# Patient Record
Sex: Male | Born: 1965 | Hispanic: No | Marital: Single | Smoking: Never smoker
Health system: Southern US, Community
[De-identification: ages and names within clinical notes are randomized; demographics above are authoritative.]

## PROBLEM LIST (undated history)

## (undated) DIAGNOSIS — R002 Palpitations: Secondary | ICD-10-CM

## (undated) HISTORY — PX: HERNIA REPAIR: SHX51

## (undated) HISTORY — PX: ABDOMINAL SURGERY: SHX537

## (undated) HISTORY — DX: Palpitations: R00.2

---

## 2006-08-05 ENCOUNTER — Encounter: Admission: RE | Admit: 2006-08-05 | Discharge: 2006-08-05 | Payer: Self-pay | Admitting: Family Medicine

## 2013-09-08 ENCOUNTER — Ambulatory Visit (INDEPENDENT_AMBULATORY_CARE_PROVIDER_SITE_OTHER): Payer: 59 | Admitting: Cardiology

## 2013-09-08 ENCOUNTER — Encounter: Payer: Self-pay | Admitting: Cardiology

## 2013-09-08 VITALS — BP 120/82 | HR 77 | Ht 70.0 in | Wt 206.0 lb

## 2013-09-08 DIAGNOSIS — R002 Palpitations: Secondary | ICD-10-CM

## 2013-09-08 DIAGNOSIS — R0989 Other specified symptoms and signs involving the circulatory and respiratory systems: Secondary | ICD-10-CM

## 2013-09-08 DIAGNOSIS — R55 Syncope and collapse: Secondary | ICD-10-CM

## 2013-09-08 DIAGNOSIS — R6889 Other general symptoms and signs: Secondary | ICD-10-CM

## 2013-09-08 NOTE — Patient Instructions (Addendum)
Your physician recommends that you continue on your current medications as directed. Please refer to the Current Medication list given to you today.  Your physician has requested that you have an exercise tolerance test. For further information please visit [email or web address]. Please also follow instruction sheet, as given.  Your physician has recommended that you wear a 24hour holter monitor (ACTDX). Holter monitors are medical devices that record the heart's electrical activity. Doctors most often use these monitors to diagnose arrhythmias. Arrhythmias are problems with the speed or rhythm of the heartbeat. The monitor is a small, portable device. You can wear one while you do your normal daily activities. This is usually used to diagnose what is causing palpitations/syncope (passing out).  Your physician has recommended that you wear an 30 day event monitor (ACTDX). Event monitors are medical devices that record the heart's electrical activity. Doctors most often us these monitors to diagnose arrhythmias. Arrhythmias are problems with the speed or rhythm of the heartbeat. The monitor is a small, portable device. You can wear one while you do your normal daily activities. This is usually used to diagnose what is causing palpitations/syncope (passing out).  Your physician recommends that you schedule a follow-up appointment in: 5 weeks with Dr. Anne FuSkains
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
## 2013-09-12 ENCOUNTER — Encounter: Payer: Self-pay | Admitting: *Deleted

## 2013-09-12 NOTE — Progress Notes (Signed)
Patient ID: Mathew Hernandez, male   DOB: 04/27/1966, 48 y.o.   MRN: [id number] Patient enrolled for [employer] to mail a ACT_EX 24 hour to 30 day cardiac event monitor to his home.

## 2013-09-21 ENCOUNTER — Telehealth: Payer: Self-pay | Admitting: Cardiology

## 2013-09-21 ENCOUNTER — Telehealth: Payer: Self-pay | Admitting: *Deleted

## 2013-09-21 NOTE — Telephone Encounter (Signed)
x

## 2013-09-21 NOTE — Telephone Encounter (Signed)
New problem    Pt needs a call back about today inregards to the monitor insturction please.

## 2013-10-03 ENCOUNTER — Telehealth (HOSPITAL_COMMUNITY): Payer: Self-pay

## 2013-10-05 ENCOUNTER — Ambulatory Visit (HOSPITAL_COMMUNITY)
Admission: RE | Admit: 2013-10-05 | Discharge: 2013-10-05 | Disposition: A | Discharge status: Home / Self Care | Payer: 59 | Source: Ambulatory Visit | Attending: Cardiovascular Disease | Admitting: Cardiovascular Disease

## 2013-10-05 ENCOUNTER — Other Ambulatory Visit: Payer: Self-pay

## 2013-10-05 ENCOUNTER — Ambulatory Visit (HOSPITAL_BASED_OUTPATIENT_CLINIC_OR_DEPARTMENT_OTHER)
Admission: RE | Admit: 2013-10-05 | Discharge: 2013-10-05 | Disposition: A | Discharge status: Home / Self Care | Payer: 59 | Source: Ambulatory Visit | Attending: Cardiovascular Disease | Admitting: Cardiovascular Disease

## 2013-10-05 DIAGNOSIS — R55 Syncope and collapse: Secondary | ICD-10-CM

## 2013-10-05 NOTE — Progress Notes (Signed)
2D Echo Performed 10/05/2013    Gaylin Bulthuis, RCS  

## 2013-10-09 ENCOUNTER — Ambulatory Visit: Payer: 59 | Admitting: Cardiology

## 2013-10-10 ENCOUNTER — Telehealth: Payer: Self-pay | Admitting: *Deleted

## 2013-10-10 NOTE — Telephone Encounter (Signed)
Informed patient of echo results per Dr. Judd Gaudier notes. He also asks about results of exercise tolerance test. Adv that has not yet been reviewed by Dr. Anne Fu. He states that he has ben feeling well for past couple weeks and because of this, and the expense of the monitor and his with his job, wearing it would be cumbersome. He thinks he's going to send it back instead of activating it, unless Dr. Anne Fu feels it is extremely, extremely imperative for him to wear it; then he will reconsider wearing it.

## 2013-10-12 NOTE — Telephone Encounter (Signed)
Follow up ° ° ° ° °Want stress test results °

## 2013-10-12 NOTE — Telephone Encounter (Signed)
Went over preliminary results with pt and informed him that Dr Anne Fu has not given his final results yet.  Told pt that after Dr Anne Fu reviews the final result, that someone from our office will contact him with this.  Pt verbalized understanding and agrees with this plan.

## 2013-10-18 ENCOUNTER — Telehealth: Payer: Self-pay | Admitting: Cardiology

## 2013-10-18 NOTE — Telephone Encounter (Signed)
Excellent, low risk stress test. Donato SchultzSKAINS, Elliet Goodnow, MD

## 2013-10-18 NOTE — Telephone Encounter (Signed)
Stress test has not been reviewed by Dr. Anne FuSkains. Will forward note to MD for review and then call pt with results.

## 2013-10-18 NOTE — Telephone Encounter (Signed)
Stress test was excellent. Low risk. (I believe I may have sent another message with this result as well. This may be duplication) I would like him to continue the monitor. Thanks.  Donato SchultzSKAINS, MARK, MD

## 2013-10-18 NOTE — Telephone Encounter (Signed)
Follow Up    Pt calling following up of stress test results. Please call.

## 2013-10-19 NOTE — Telephone Encounter (Signed)
Spoke with pt and reviewed stress test results with him. Also instructed him Dr. Anne FuSkains would like him to wear event monitor.  Pt reports he has sent the monitor back as he was unable to find out what out of pocket cost would be to him and also because it would be difficult to wear while he is working. 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 Pt is asking about having a heart cath as a friend recently had to have bypass surgery after having testing that did not show problems.  Pt reports he is feeling well. No chest pain. No irregular heart beat. No syncope. Symptoms he had prior to seeing Dr. Anne FuSkains have resolved.  Will send to Dr. Anne FuSkains for further recommendations.
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
## 2013-10-19 NOTE — Telephone Encounter (Signed)
We would only proceed with heart catheterization if symptoms of chest pain/angina occurred.

## 2013-11-02 NOTE — Telephone Encounter (Signed)
Encounter complete. 

## 2013-11-16 NOTE — Telephone Encounter (Signed)
Encounter complete. 

## 2016-05-15 DIAGNOSIS — E291 Testicular hypofunction: Secondary | ICD-10-CM | POA: Diagnosis not present

## 2016-05-28 DIAGNOSIS — E291 Testicular hypofunction: Secondary | ICD-10-CM | POA: Diagnosis not present

## 2016-06-11 DIAGNOSIS — E291 Testicular hypofunction: Secondary | ICD-10-CM | POA: Diagnosis not present

## 2016-06-26 DIAGNOSIS — E349 Endocrine disorder, unspecified: Secondary | ICD-10-CM | POA: Diagnosis not present

## 2016-07-10 DIAGNOSIS — E291 Testicular hypofunction: Secondary | ICD-10-CM | POA: Diagnosis not present

## 2016-07-24 DIAGNOSIS — E291 Testicular hypofunction: Secondary | ICD-10-CM | POA: Diagnosis not present

## 2016-08-07 DIAGNOSIS — E291 Testicular hypofunction: Secondary | ICD-10-CM | POA: Diagnosis not present

## 2016-08-24 DIAGNOSIS — E349 Endocrine disorder, unspecified: Secondary | ICD-10-CM | POA: Diagnosis not present

## 2016-09-09 DIAGNOSIS — Z79899 Other long term (current) drug therapy: Secondary | ICD-10-CM | POA: Diagnosis not present

## 2016-09-09 DIAGNOSIS — G4733 Obstructive sleep apnea (adult) (pediatric): Secondary | ICD-10-CM | POA: Diagnosis not present

## 2016-09-09 DIAGNOSIS — E349 Endocrine disorder, unspecified: Secondary | ICD-10-CM | POA: Diagnosis not present

## 2016-09-09 DIAGNOSIS — E781 Pure hyperglyceridemia: Secondary | ICD-10-CM | POA: Diagnosis not present

## 2016-09-09 DIAGNOSIS — R7303 Prediabetes: Secondary | ICD-10-CM | POA: Diagnosis not present

## 2016-09-24 DIAGNOSIS — E349 Endocrine disorder, unspecified: Secondary | ICD-10-CM | POA: Diagnosis not present

## 2016-10-09 DIAGNOSIS — E349 Endocrine disorder, unspecified: Secondary | ICD-10-CM | POA: Diagnosis not present

## 2016-10-21 DIAGNOSIS — E349 Endocrine disorder, unspecified: Secondary | ICD-10-CM | POA: Diagnosis not present

## 2016-11-05 DIAGNOSIS — E349 Endocrine disorder, unspecified: Secondary | ICD-10-CM | POA: Diagnosis not present

## 2016-11-19 DIAGNOSIS — E349 Endocrine disorder, unspecified: Secondary | ICD-10-CM | POA: Diagnosis not present

## 2016-12-03 DIAGNOSIS — E349 Endocrine disorder, unspecified: Secondary | ICD-10-CM | POA: Diagnosis not present

## 2016-12-18 DIAGNOSIS — E291 Testicular hypofunction: Secondary | ICD-10-CM | POA: Diagnosis not present

## 2017-01-01 DIAGNOSIS — E291 Testicular hypofunction: Secondary | ICD-10-CM | POA: Diagnosis not present

## 2017-01-15 DIAGNOSIS — E349 Endocrine disorder, unspecified: Secondary | ICD-10-CM | POA: Diagnosis not present

## 2017-01-28 DIAGNOSIS — E349 Endocrine disorder, unspecified: Secondary | ICD-10-CM | POA: Diagnosis not present

## 2017-02-11 DIAGNOSIS — E349 Endocrine disorder, unspecified: Secondary | ICD-10-CM | POA: Diagnosis not present

## 2017-02-23 DIAGNOSIS — E349 Endocrine disorder, unspecified: Secondary | ICD-10-CM | POA: Diagnosis not present

## 2017-03-10 DIAGNOSIS — E349 Endocrine disorder, unspecified: Secondary | ICD-10-CM | POA: Diagnosis not present

## 2017-03-17 DIAGNOSIS — E349 Endocrine disorder, unspecified: Secondary | ICD-10-CM | POA: Diagnosis not present

## 2017-03-17 DIAGNOSIS — R7303 Prediabetes: Secondary | ICD-10-CM | POA: Diagnosis not present

## 2017-03-17 DIAGNOSIS — E781 Pure hyperglyceridemia: Secondary | ICD-10-CM | POA: Diagnosis not present

## 2017-03-17 DIAGNOSIS — Z79899 Other long term (current) drug therapy: Secondary | ICD-10-CM | POA: Diagnosis not present

## 2017-03-23 DIAGNOSIS — E349 Endocrine disorder, unspecified: Secondary | ICD-10-CM | POA: Diagnosis not present

## 2017-04-06 DIAGNOSIS — E349 Endocrine disorder, unspecified: Secondary | ICD-10-CM | POA: Diagnosis not present

## 2017-04-13 ENCOUNTER — Other Ambulatory Visit: Payer: Self-pay

## 2017-04-13 ENCOUNTER — Emergency Department (HOSPITAL_COMMUNITY)
Admission: EM | Admit: 2017-04-13 | Discharge: 2017-04-13 | Disposition: A | Discharge status: Home / Self Care | Payer: Worker's Compensation | Attending: Emergency Medicine | Admitting: Emergency Medicine

## 2017-04-13 DIAGNOSIS — T5801XA Toxic effect of carbon monoxide from motor vehicle exhaust, accidental (unintentional), initial encounter: Secondary | ICD-10-CM | POA: Diagnosis present

## 2017-04-13 LAB — CARBOXYHEMOGLOBIN - COOX: Carboxyhemoglobin: 1 % (ref 0.5–1.5)
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
## 2017-04-13 NOTE — Discharge Instructions (Signed)
You have been evaluated for carbon monoxide poisoning.  It appears to be minor at this time.  Your carboxyhemoglobin level was 1.0%, which is within the normal range. Please return to the ED for worsening headache, vomiting, persistent dizziness, confusion, loss of balance, chest pain, shortness of breath, or any other major concerns.  Your blood pressure was noted to be elevated today. Please follow up with your primary care provider as soon as possible on this matter.
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
## 2017-04-20 DIAGNOSIS — E349 Endocrine disorder, unspecified: Secondary | ICD-10-CM | POA: Diagnosis not present

## 2017-05-05 DIAGNOSIS — E349 Endocrine disorder, unspecified: Secondary | ICD-10-CM | POA: Diagnosis not present

## 2017-05-19 DIAGNOSIS — E349 Endocrine disorder, unspecified: Secondary | ICD-10-CM | POA: Diagnosis not present

## 2017-06-02 DIAGNOSIS — E349 Endocrine disorder, unspecified: Secondary | ICD-10-CM | POA: Diagnosis not present

## 2017-06-16 DIAGNOSIS — E349 Endocrine disorder, unspecified: Secondary | ICD-10-CM | POA: Diagnosis not present

## 2017-06-30 DIAGNOSIS — E349 Endocrine disorder, unspecified: Secondary | ICD-10-CM | POA: Diagnosis not present

## 2017-07-16 DIAGNOSIS — E349 Endocrine disorder, unspecified: Secondary | ICD-10-CM | POA: Diagnosis not present

## 2017-07-30 DIAGNOSIS — E349 Endocrine disorder, unspecified: Secondary | ICD-10-CM | POA: Diagnosis not present

## 2017-08-13 DIAGNOSIS — E349 Endocrine disorder, unspecified: Secondary | ICD-10-CM | POA: Diagnosis not present

## 2017-08-27 DIAGNOSIS — E349 Endocrine disorder, unspecified: Secondary | ICD-10-CM | POA: Diagnosis not present

## 2017-09-10 DIAGNOSIS — E349 Endocrine disorder, unspecified: Secondary | ICD-10-CM | POA: Diagnosis not present

## 2017-09-14 DIAGNOSIS — R7303 Prediabetes: Secondary | ICD-10-CM | POA: Diagnosis not present

## 2017-09-14 DIAGNOSIS — Z79899 Other long term (current) drug therapy: Secondary | ICD-10-CM | POA: Diagnosis not present

## 2017-09-14 DIAGNOSIS — G4733 Obstructive sleep apnea (adult) (pediatric): Secondary | ICD-10-CM | POA: Diagnosis not present

## 2017-09-14 DIAGNOSIS — E349 Endocrine disorder, unspecified: Secondary | ICD-10-CM | POA: Diagnosis not present

## 2017-09-14 DIAGNOSIS — E781 Pure hyperglyceridemia: Secondary | ICD-10-CM | POA: Diagnosis not present

## 2017-09-24 DIAGNOSIS — E349 Endocrine disorder, unspecified: Secondary | ICD-10-CM | POA: Diagnosis not present

## 2017-10-07 DIAGNOSIS — E349 Endocrine disorder, unspecified: Secondary | ICD-10-CM | POA: Diagnosis not present

## 2017-10-13 DIAGNOSIS — E349 Endocrine disorder, unspecified: Secondary | ICD-10-CM | POA: Diagnosis not present

## 2017-10-21 DIAGNOSIS — E349 Endocrine disorder, unspecified: Secondary | ICD-10-CM | POA: Diagnosis not present

## 2017-11-01 ENCOUNTER — Encounter (HOSPITAL_COMMUNITY): Payer: Self-pay | Admitting: *Deleted

## 2017-11-01 ENCOUNTER — Emergency Department (HOSPITAL_COMMUNITY)
Admission: EM | Admit: 2017-11-01 | Discharge: 2017-11-01 | Disposition: A | Discharge status: Home / Self Care | Payer: 59 | Attending: Emergency Medicine | Admitting: Emergency Medicine

## 2017-11-01 ENCOUNTER — Emergency Department (HOSPITAL_COMMUNITY): Payer: 59

## 2017-11-01 ENCOUNTER — Other Ambulatory Visit: Payer: Self-pay

## 2017-11-01 DIAGNOSIS — R002 Palpitations: Secondary | ICD-10-CM

## 2017-11-01 DIAGNOSIS — R0789 Other chest pain: Secondary | ICD-10-CM | POA: Diagnosis not present

## 2017-11-01 LAB — BASIC METABOLIC PANEL WITH GFR
Anion gap: 11 (ref 5–15)
BUN: 11 mg/dL (ref 6–20)
CO2: 26 mmol/L (ref 22–32)
Calcium: 9.2 mg/dL (ref 8.9–10.3)
Chloride: 102 mmol/L (ref 98–111)
Creatinine, Ser: 1.26 mg/dL — ABNORMAL HIGH (ref 0.61–1.24)
GFR calc Af Amer: >60 mL/min
GFR calc non Af Amer: >60 mL/min
Glucose, Bld: 155 mg/dL — ABNORMAL HIGH (ref 70–99)
Potassium: 3.9 mmol/L (ref 3.5–5.1)
Sodium: 139 mmol/L (ref 135–145)

## 2017-11-01 LAB — CBC
HCT: 53 % — ABNORMAL HIGH (ref 39.0–52.0)
Hemoglobin: 18 g/dL — ABNORMAL HIGH (ref 13.0–17.0)
MCH: 29.7 pg (ref 26.0–34.0)
MCHC: 34 g/dL (ref 30.0–36.0)
MCV: 87.3 fL (ref 78.0–100.0)
Platelets: 267 K/uL (ref 150–400)
RBC: 6.07 MIL/uL — ABNORMAL HIGH (ref 4.22–5.81)
RDW: 12 % (ref 11.5–15.5)
WBC: 7.9 K/uL (ref 4.0–10.5)

## 2017-11-01 LAB — I-STAT TROPONIN, ED: Troponin i, poc: 0 ng/mL (ref 0.00–0.08)

## 2017-11-01 MED ORDER — METOPROLOL SUCCINATE ER 25 MG PO TB24: 25.0000 mg | ORAL_TABLET | Freq: Every day | ORAL | 0 refills | Status: DC

## 2017-11-01 NOTE — ED Triage Notes (Signed)
Pt states lightheadedness and palpitations for the last several weeks, but has been increasing recently.

## 2017-11-01 NOTE — ED Provider Notes (Signed)
[facility] Provider Note   CSN: [id number] Arrival date & time: 11/01/17  1507     History   Chief Complaint Chief Complaint  Patient presents with  . Palpitations    HPI Mathew Hernandez is a 52 y.o. male presenting for evaluation of palpitations.  Patient states over the past 3 to 5 weeks, he has been having intermittent palpitations.  He states that intermittently throughout the day, he will feel something abnormal in his chest and feel a "shot of anxiety."  This is not associated with exertion, oral intake, time of day, or caffeine use.  Patient denies cardiac history, although states several years ago he was found to have an arrhythmia.  He had a normal stress test and echo, was suggested that he wear a Holter monitor, but refused at that time.  He has not followed up with cardiology since.  He has a history of sleep apnea, but has not been wearing his CPAP regularly.  No other medical problems.  He denies associated chest pain or shortness of breath when he feels the palpitation.  He denies recent fevers, chills, cough, nausea, vomiting, abdominal pain, urinary symptoms, abnormal bowel movements.  He does not smoke cigarettes, denies alcohol use, denies drug use.  He denies a family history of MI.   HPI  History reviewed. No pertinent past medical history.  There are no active problems to display for this patient.   Past Surgical History:  Procedure Laterality Date  . ABDOMINAL SURGERY    . HERNIA REPAIR     r inguinal        Home Medications    Prior to Admission medications   Medication Sig Start Date End Date Taking? Authorizing Provider  metoprolol succinate (TOPROL-XL) 25 MG 24 hr tablet Take 1 tablet (25 mg total) by mouth daily. 11/01/17   Arihanna Estabrook, PA-C    Family History Family History  Problem Relation Age of Onset  . Diabetes Mother   . Hypertension Father     Social History Social History   Tobacco  Use  . Smoking status: Never Smoker  . Smokeless tobacco: Never Used  Substance Use Topics  . Alcohol use: Never    Frequency: Never  . Drug use: Never     Allergies   Patient has no known allergies.   Review of Systems Review of Systems  Cardiovascular: Positive for palpitations.  All other systems reviewed and are negative.    Physical Exam Updated Vital Signs BP (!) 144/94 (BP Location: Right Arm)   Pulse 82   Temp 97.9 F (36.6 C) (Oral)   Resp 14   Ht 5\' 10"  (1.778 m)   Wt 90.7 kg (200 lb)   SpO2 94%   BMI 28.70 kg/m   Physical Exam  Constitutional: He is oriented to person, place, and time. He appears well-developed and well-nourished. No distress.  Appears no distress  HENT:  Head: Normocephalic and atraumatic.  Eyes: Pupils are equal, round, and reactive to light. Conjunctivae and EOM are normal.  Neck: Normal range of motion. Neck supple.  Cardiovascular: Normal rate and intact distal pulses.  Occasional skipped beat.  Otherwise regular rate and rhythm.  Pulmonary/Chest: Effort normal and breath sounds normal. No respiratory distress. He has no wheezes.  Abdominal: Soft. He exhibits no distension and no mass. There is no tenderness. There is no guarding.  Musculoskeletal: Normal range of motion. He exhibits no edema or tenderness.  No leg pain  or swelling.  Radial pedal pulses intact bilaterally  Neurological: He is alert and oriented to person, place, and time.  Skin: Skin is warm and dry.  Psychiatric: He has a normal mood and affect.  Nursing note and vitals reviewed.    ED Treatments / Results  Labs (all labs ordered are listed, but only abnormal results are displayed) Labs Reviewed  BASIC METABOLIC PANEL - Abnormal; Notable for the following components:      Result Value   Glucose, Bld 155 (*)    Creatinine, Ser 1.26 (*)    All other components within normal limits  CBC - Abnormal; Notable for the following components:   RBC 6.07 (*)     Hemoglobin 18.0 (*)    HCT 53.0 (*)    All other components within normal limits  I-STAT TROPONIN, ED    EKG None  Radiology Dg Chest 2 View  Result Date: 11/01/2017 CLINICAL DATA:  Lightheadedness and palpitations for the past several weeks with increasing frequency. History of diabetes. Nonsmoker. EXAM: CHEST - 2 VIEW COMPARISON:  Chest x-ray included with an abdominal series dated August 05, 2006 FINDINGS: The lungs are well-expanded. The interstitial markings are mildly prominent. There is no alveolar infiltrate or pleural effusion. The heart and pulmonary vascularity are normal. The mediastinum is normal in width. The bony thorax is unremarkable. IMPRESSION: Mildly increased interstitial markings bilaterally suggest chronic bronchitic changes. No alveolar pneumonia nor CHF. Electronically Signed   By: David  [place] M.D.   On: 11/01/2017 15:42    Procedures Procedures (including critical care time)  Medications Ordered in ED Medications - No data to display   Initial Impression / Assessment and Plan / ED Course  I have reviewed the triage vital signs and the nursing notes.  Pertinent labs & imaging results that were available during my care of the patient were reviewed by me and considered in my medical decision making (see chart for details).     Patient presented for evaluation of palpitations.  Physical exam reassuring, patient appears nontoxic.  Speaking in full sentences without difficulty.  Cardiac exam shows occasional skipped beat without irregular rhythm.  Initial work-up reassuring, troponin negative.  Chest x-ray reviewed and interpreted by me, no pneumonia, pneumothorax, or effusions.  EKG shows PVC, no stemi. Case discussed with attending, Dr. Estell Harpin agrees to plan.  Will discharge patient on metoprolol for symptomatic control palpitations.  Follow-up with cardiology for further evaluation and management.  Discussed cessation of caffeine use.  At this time, patient appears  safe for discharge.  Return precautions given.  Patient states he understands and agrees to plan.   Final Clinical Impressions(s) / ED Diagnoses   Final diagnoses:  Palpitations    ED Discharge Orders        Ordered    metoprolol succinate (TOPROL-XL) 25 MG 24 hr tablet  Daily     11/01/17 1829       Alveria Apley, PA-C 11/01/17 2141    Bethann Berkshire, MD 11/04/17 [phone]

## 2017-11-01 NOTE — ED Notes (Signed)
D/c reviewed with patient 

## 2017-11-01 NOTE — Discharge Instructions (Addendum)
Take metoprolol once a day.  Have caution while taking this medicine, if you become dizzy, lightheaded, or feel like you are about to pass out, stop taking this medication. Stop using caffeine, this includes Excedrin, caffeine pills, and 5-hour energy. Follow-up with cardiology for further evaluation and management of your symptoms. Return to the emergency room if you develop chest pain, difficulty breathing, or any new or concerning symptoms.

## 2017-11-01 NOTE — ED Notes (Addendum)
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

## 2017-11-05 DIAGNOSIS — E349 Endocrine disorder, unspecified: Secondary | ICD-10-CM | POA: Diagnosis not present

## 2017-11-17 DIAGNOSIS — Z79899 Other long term (current) drug therapy: Secondary | ICD-10-CM | POA: Diagnosis not present

## 2017-11-17 DIAGNOSIS — R000 Tachycardia, unspecified: Secondary | ICD-10-CM | POA: Diagnosis not present

## 2017-11-19 DIAGNOSIS — E349 Endocrine disorder, unspecified: Secondary | ICD-10-CM | POA: Diagnosis not present

## 2017-11-26 DIAGNOSIS — R002 Palpitations: Secondary | ICD-10-CM | POA: Insufficient documentation

## 2017-12-03 DIAGNOSIS — E349 Endocrine disorder, unspecified: Secondary | ICD-10-CM | POA: Diagnosis not present

## 2017-12-17 DIAGNOSIS — E349 Endocrine disorder, unspecified: Secondary | ICD-10-CM | POA: Diagnosis not present

## 2017-12-21 ENCOUNTER — Ambulatory Visit: Payer: 59 | Admitting: Cardiology

## 2017-12-21 ENCOUNTER — Encounter: Payer: Self-pay | Admitting: Cardiology

## 2017-12-21 VITALS — BP 130/90 | HR 98 | Ht 70.0 in | Wt 226.0 lb

## 2017-12-21 DIAGNOSIS — R002 Palpitations: Secondary | ICD-10-CM | POA: Diagnosis not present

## 2017-12-21 MED ORDER — METOPROLOL SUCCINATE ER 25 MG PO TB24: 25.0000 mg | ORAL_TABLET | Freq: Every day | ORAL | 3 refills | Status: DC

## 2017-12-21 NOTE — Patient Instructions (Addendum)
Medication Instructions:  The current medical regimen is effective;  continue present plan and medications.  Testing/Procedures: Your physician has requested that you have cardiac calcium score.  This test is done by way of computed tomography (CT) and is a painless test that uses an x-ray machine to take clear, detailed pictures of your heart. For further information please visit [email or web address].   Follow-Up: Follow up in 1 year with Dr. Anne FuSkains.  You will receive a letter in the mail 2 months before you are due.  Please call us when you receive this letter to schedule your follow up appointment.  If you need a refill on your cardiac medications before your next appointment, please call your pharmacy.  Thank you for choosing [facility]!!

## 2017-12-21 NOTE — Progress Notes (Signed)
      [address] [place], [place]  [address] Phone: [phone](336) [phone] Fax:  [phone]) [phone]  Date:  12/21/2017   ID:  Mathew Hernandez, DOB 15-Nov-1965, MRN [id number]  PCP:  Lonie Peakonroy, Nathan, PA-C   History of Present Illness: Mathew Hernandez is a 52 y.o. male here by self-referral for heart arrhythmia.  [place]. [place]. PVC's. Metoprolol given. Police.  Feeling better.  He would like to have metoprolol.  We also discussed potential cardiovascular risk.  He is taking testosterone supplementation.  No smoking.  Has obstructive sleep apnea, having some issues with his CPAP.   Wt Readings from Last 3 Encounters:  12/21/17 226 lb (102.5 kg)  11/01/17 200 lb (90.7 kg)  04/13/17 200 lb (90.7 kg)     Past Medical History:  Diagnosis Date  . Heart palpitations     Past Surgical History:  Procedure Laterality Date  . ABDOMINAL SURGERY    . HERNIA REPAIR     r inguinal    Current Outpatient Medications  Medication Sig Dispense Refill  . metoprolol succinate (TOPROL-XL) 25 MG 24 hr tablet Take 1 tablet (25 mg total) by mouth daily. 90 tablet 3   No current facility-administered medications for this visit.     Allergies:   No Known Allergies  Social History:  The patient  reports that he has never smoked. He has never used smokeless tobacco. He reports that he does not drink alcohol or use drugs. 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   Family History  Problem Relation Age of Onset  . Diabetes Mother   . Hypertension Father   HTN in family. No early CAD, SCD  ROS:  Please see the history of present illness.  All other review of systems negative. PHYSICAL EXAM: VS:  BP 130/90   Pulse 98   Ht 5\' 10"  (1.778 m)   Wt 226 lb (102.5 kg)   SpO2 97%   BMI 32.43 kg/m  GEN: Well nourished, well developed, in no acute distress  HEENT: normal  Neck: no JVD, carotid bruits, or masses Cardiac: RRR; no murmurs, rubs, or gallops,no edema    Respiratory:  clear to auscultation bilaterally, normal work of breathing GI: soft, nontender, nondistended, + BS MS: no deformity or atrophy  Skin: warm and dry, no rash Neuro:  Alert and Oriented x 3, Strength and sensation are intact Psych: euthymic mood, full affect   EKG:  Prior sinus rhythm with PVCs noted 11/01/2017-prior ectopic atrial rhythm, negative T waves in the flexion and lead 3. rhythm, 77, sinus arrhythmia, normal intervals. Labs: HDL 31, LDL 136, triglycerides 306, hemoglobin A1c 5.7, hemoglobin 17.2, creatinine 1.1  ASSESSMENT AND PLAN:   Palpitations -I am okay with him continuing with his Toprol-XL 25 mg once a day.  PVCs noted on telemetry in emergency department.  Previously normal stress test and echocardiogram.  Cardiac risk -He is concerned about potential for coronary artery disease.  Asymptomatic currently.  We will check a coronary calcium score.  We discussed the potential for statin therapy if abnormal.  Continue with diet, exercise, vegetables.  OSA -Encourage CPAP  Signed, Donato SchultzMark Dalia Jollie, MD [facility]  12/21/2017 2:39 PM

## 2018-01-05 ENCOUNTER — Ambulatory Visit (INDEPENDENT_AMBULATORY_CARE_PROVIDER_SITE_OTHER)
Admission: RE | Admit: 2018-01-05 | Discharge: 2018-01-05 | Disposition: A | Discharge status: Home / Self Care | Payer: 59 | Source: Ambulatory Visit | Attending: Cardiology | Admitting: Cardiology

## 2018-01-05 DIAGNOSIS — R002 Palpitations: Secondary | ICD-10-CM

## 2018-01-06 DIAGNOSIS — E349 Endocrine disorder, unspecified: Secondary | ICD-10-CM | POA: Diagnosis not present

## 2018-01-10 ENCOUNTER — Telehealth: Payer: Self-pay | Admitting: Cardiology

## 2018-01-10 NOTE — Telephone Encounter (Signed)
Pt has been notified of Calcium Score result 0. Pt aware to keep working on diet and exercise. Pt thanked me for the call.

## 2018-01-10 NOTE — Telephone Encounter (Signed)
-----   Message from Jake Bathe, MD sent at 01/06/2018  6:40 AM EDT ----- Excellent result - score is 0.  Continue with diet/exercise. Donato Schultz, MD

## 2018-01-10 NOTE — Telephone Encounter (Signed)
New Message ° ° °Pt returning call for nurse about results ° ° ° °

## 2018-01-20 DIAGNOSIS — E349 Endocrine disorder, unspecified: Secondary | ICD-10-CM | POA: Diagnosis not present

## 2018-02-03 DIAGNOSIS — E349 Endocrine disorder, unspecified: Secondary | ICD-10-CM | POA: Diagnosis not present

## 2018-02-17 DIAGNOSIS — E349 Endocrine disorder, unspecified: Secondary | ICD-10-CM | POA: Diagnosis not present

## 2018-03-08 DIAGNOSIS — E349 Endocrine disorder, unspecified: Secondary | ICD-10-CM | POA: Diagnosis not present

## 2018-03-09 ENCOUNTER — Other Ambulatory Visit: Payer: Self-pay | Admitting: Physician Assistant

## 2018-03-09 DIAGNOSIS — I80292 Phlebitis and thrombophlebitis of other deep vessels of left lower extremity: Secondary | ICD-10-CM | POA: Diagnosis not present

## 2018-03-09 DIAGNOSIS — Z6832 Body mass index (BMI) 32.0-32.9, adult: Secondary | ICD-10-CM | POA: Diagnosis not present

## 2018-03-10 ENCOUNTER — Ambulatory Visit
Admission: RE | Admit: 2018-03-10 | Discharge: 2018-03-10 | Disposition: A | Discharge status: Home / Self Care | Payer: 59 | Source: Ambulatory Visit | Attending: Physician Assistant | Admitting: Physician Assistant

## 2018-03-10 DIAGNOSIS — I80292 Phlebitis and thrombophlebitis of other deep vessels of left lower extremity: Secondary | ICD-10-CM

## 2018-03-10 DIAGNOSIS — R600 Localized edema: Secondary | ICD-10-CM | POA: Diagnosis not present

## 2018-03-10 DIAGNOSIS — M79662 Pain in left lower leg: Secondary | ICD-10-CM | POA: Diagnosis not present

## 2018-03-15 DIAGNOSIS — E349 Endocrine disorder, unspecified: Secondary | ICD-10-CM | POA: Diagnosis not present

## 2018-03-15 DIAGNOSIS — Z79899 Other long term (current) drug therapy: Secondary | ICD-10-CM | POA: Diagnosis not present

## 2018-03-15 DIAGNOSIS — R002 Palpitations: Secondary | ICD-10-CM | POA: Diagnosis not present

## 2018-03-15 DIAGNOSIS — E781 Pure hyperglyceridemia: Secondary | ICD-10-CM | POA: Diagnosis not present

## 2018-03-15 DIAGNOSIS — R7303 Prediabetes: Secondary | ICD-10-CM | POA: Diagnosis not present

## 2018-03-30 DIAGNOSIS — E349 Endocrine disorder, unspecified: Secondary | ICD-10-CM | POA: Diagnosis not present

## 2018-04-13 DIAGNOSIS — E349 Endocrine disorder, unspecified: Secondary | ICD-10-CM | POA: Diagnosis not present

## 2018-04-28 DIAGNOSIS — E349 Endocrine disorder, unspecified: Secondary | ICD-10-CM | POA: Diagnosis not present

## 2018-05-11 DIAGNOSIS — E349 Endocrine disorder, unspecified: Secondary | ICD-10-CM | POA: Diagnosis not present

## 2018-05-25 DIAGNOSIS — E349 Endocrine disorder, unspecified: Secondary | ICD-10-CM | POA: Diagnosis not present

## 2018-06-09 DIAGNOSIS — E349 Endocrine disorder, unspecified: Secondary | ICD-10-CM | POA: Diagnosis not present

## 2018-06-24 DIAGNOSIS — E349 Endocrine disorder, unspecified: Secondary | ICD-10-CM | POA: Diagnosis not present

## 2018-07-07 DIAGNOSIS — E349 Endocrine disorder, unspecified: Secondary | ICD-10-CM | POA: Diagnosis not present

## 2018-07-20 DIAGNOSIS — E349 Endocrine disorder, unspecified: Secondary | ICD-10-CM | POA: Diagnosis not present

## 2018-08-04 DIAGNOSIS — E349 Endocrine disorder, unspecified: Secondary | ICD-10-CM | POA: Diagnosis not present

## 2018-08-19 DIAGNOSIS — E349 Endocrine disorder, unspecified: Secondary | ICD-10-CM | POA: Diagnosis not present

## 2018-09-01 DIAGNOSIS — E349 Endocrine disorder, unspecified: Secondary | ICD-10-CM | POA: Diagnosis not present

## 2018-09-13 DIAGNOSIS — Z1211 Encounter for screening for malignant neoplasm of colon: Secondary | ICD-10-CM | POA: Diagnosis not present

## 2018-09-13 DIAGNOSIS — R7303 Prediabetes: Secondary | ICD-10-CM | POA: Diagnosis not present

## 2018-09-13 DIAGNOSIS — E349 Endocrine disorder, unspecified: Secondary | ICD-10-CM | POA: Diagnosis not present

## 2018-09-13 DIAGNOSIS — R002 Palpitations: Secondary | ICD-10-CM | POA: Diagnosis not present

## 2018-09-22 DIAGNOSIS — E349 Endocrine disorder, unspecified: Secondary | ICD-10-CM | POA: Diagnosis not present

## 2018-11-01 IMAGING — CR DG CHEST 2V
2 series · 2 of 2 positions shown · non-contrast
Comparison: Chest x-ray included with an abdominal series dated
August 05, 2006

CLINICAL DATA: Lightheadedness and palpitations for the past
several weeks with increasing frequency. History of diabetes.
Nonsmoker.

EXAM:
CHEST - 2 VIEW

[chest pa]
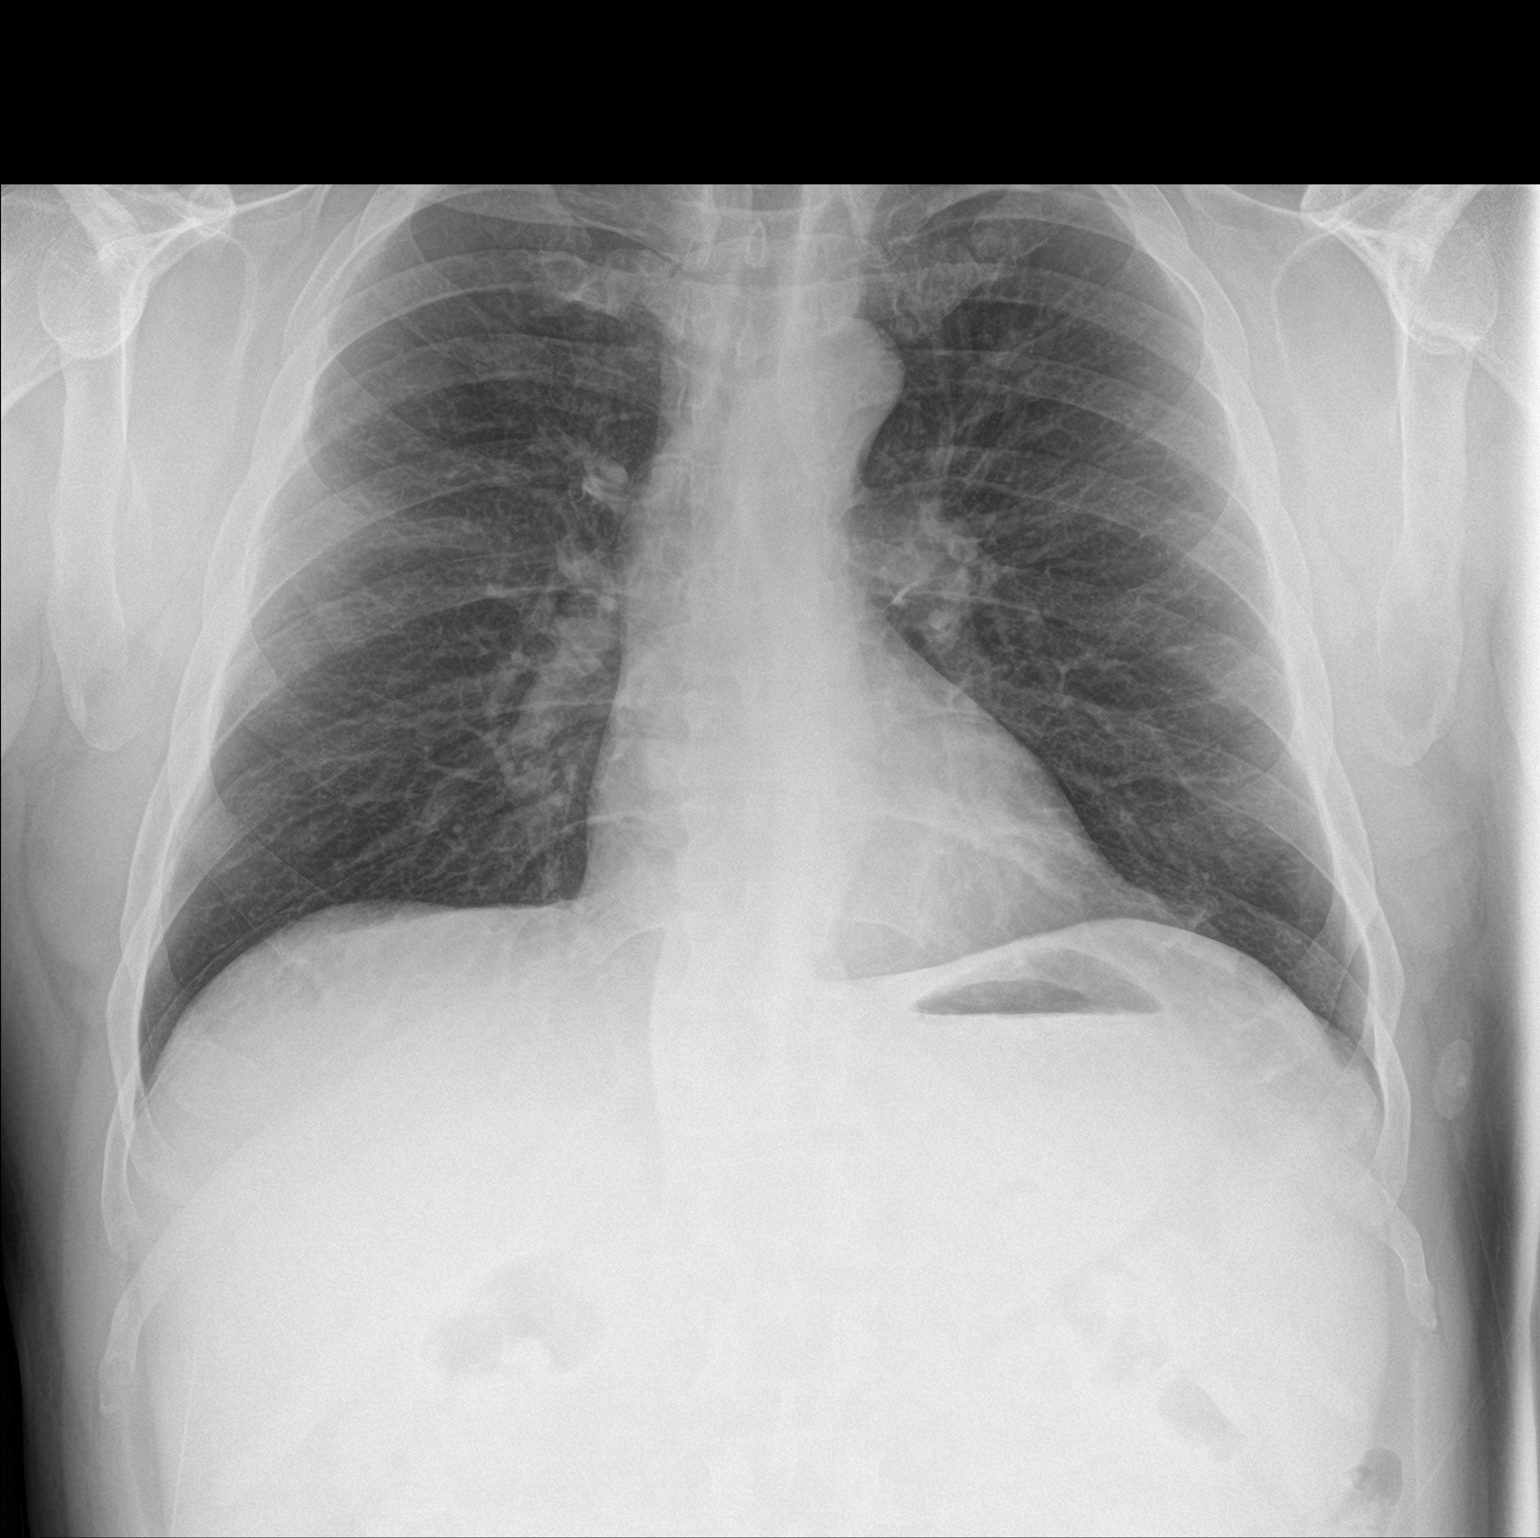

[chest lat]
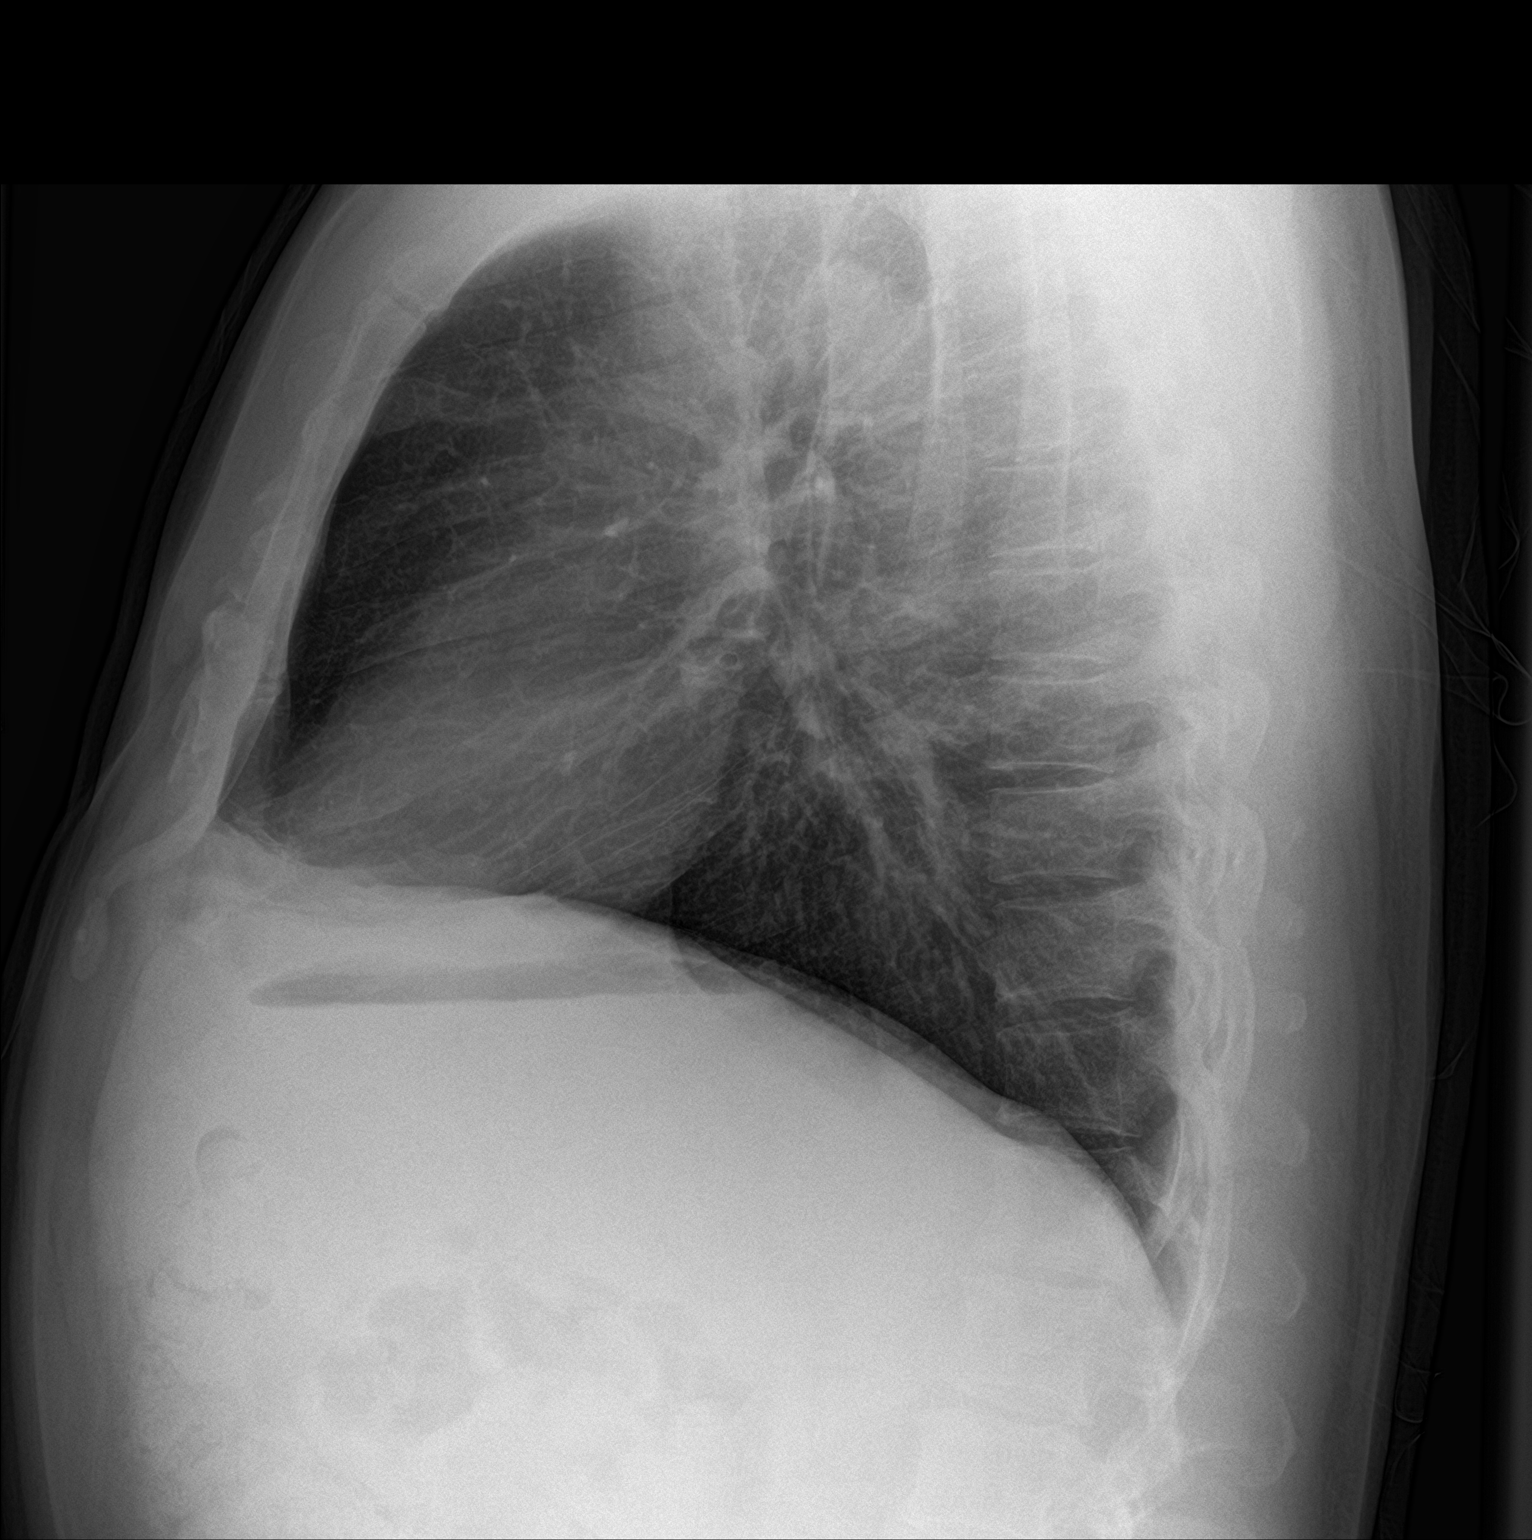

[2 of 2 positions shown; findings below may reference images not displayed]

FINDINGS: The lungs are well-expanded. The interstitial markings are mildly
prominent. There is no alveolar infiltrate or pleural effusion. The
heart and pulmonary vascularity are normal. The mediastinum is
normal in width. The bony thorax is unremarkable.
IMPRESSION: Mildly increased interstitial markings bilaterally suggest chronic
bronchitic changes. No alveolar pneumonia nor CHF.

## 2018-12-13 ENCOUNTER — Other Ambulatory Visit: Payer: Self-pay

## 2018-12-13 ENCOUNTER — Encounter (HOSPITAL_COMMUNITY): Payer: Self-pay

## 2018-12-13 ENCOUNTER — Ambulatory Visit (HOSPITAL_COMMUNITY)
Admission: EM | Admit: 2018-12-13 | Discharge: 2018-12-13 | Disposition: A | Discharge status: Home / Self Care | Payer: No Typology Code available for payment source | Attending: Family Medicine | Admitting: Family Medicine

## 2018-12-13 DIAGNOSIS — W101XXA Fall (on)(from) sidewalk curb, initial encounter: Secondary | ICD-10-CM

## 2018-12-13 DIAGNOSIS — S86111A Strain of other muscle(s) and tendon(s) of posterior muscle group at lower leg level, right leg, initial encounter: Secondary | ICD-10-CM | POA: Diagnosis not present

## 2018-12-13 DIAGNOSIS — R030 Elevated blood-pressure reading, without diagnosis of hypertension: Secondary | ICD-10-CM

## 2018-12-13 NOTE — Discharge Instructions (Signed)
Continue taking over the counter Aleve twice daily with food. Ice your calf muscle a few times a day for the next 24-48 hours.

## 2018-12-13 NOTE — ED Provider Notes (Signed)
[facility]   [id number] 12/13/18 Arrival Time: 1628  ASSESSMENT & PLAN:  1. Gastrocnemius muscle strain, right, initial encounter     Work letter provided. Number for Occupational  Health given. Recommend limited weight bearing for the next 24 hours. To call tomorrow am and schedule a follow up evaluation.    Discharge Instructions     Continue taking over the counter Aleve twice daily with food. Ice your calf muscle a few times a day for the next 24-48 hours.     Follow-up Information    Schedule an appointment as soon as possible for a visit  with [facility].          Also discussed his elevated BP today. Taking Toprol. Recommend scheduling appt with his PCP for a recheck. Reviewed expectations re: course of current medical issues. Questions answered. Outlined signs and symptoms indicating need for more acute intervention. Patient verbalized understanding. After Visit Summary given.  SUBJECTIVE: History from: patient. Mathew Hernandez is a 53 y.o. male who reports persistent moderate pain of his right medial calf; described as aching with occasional sharp pain; without radiation. Onset: abrupt, < 12 hours ago. Injury/trama: reports stepping off of curb and feeling a sharp pain in his right medial calf; no direct trauma. Symptoms have progressed to a point and plateaued since beginning. Aggravating factors: ambulation. Alleviating factors: rest. Associated symptoms: none reported. Extremity sensation changes or weakness: none. Self treatment: has not tried OTCs for relief of pain. History of similar: no.  Past Surgical History:  Procedure Laterality Date  . ABDOMINAL SURGERY    . HERNIA REPAIR     r inguinal     ROS: As per HPI. All other systems negative.    OBJECTIVE:  Vitals:   12/13/18 1650  BP: (!) 174/112  Pulse: 99  Resp: 17  Temp: 98.4 F (36.9 C)  TempSrc: Oral  SpO2: 100%    General appearance: alert; no  distress HEENT: [place]; AT Neck: supple with FROM Resp: unlabored respirations Extremities: . RLE: warm and well perfused; well localized moderate tenderness over right medial calf at distal gastrocnemius muscle; no bunching of muscle; without gross deformities; with no swelling; with no bruising; ROM: normal with reported discomfort CV: brisk extremity capillary refill of RLE; 2+ DP pulse of RLE. Skin: warm and dry; no visible rashes Neurologic: gait normal but favors RLE; normal sensation of RLE; normal strength of RLE Psychological: alert and cooperative; normal mood and affect  No Known Allergies  Past Medical History:  Diagnosis Date  . Heart palpitations    Social History   Socioeconomic History  . Marital status: Single    Spouse name: Not on file  . Number of children: Not on file  . Years of education: Not on file  . Highest education level: Not on file  Occupational History  . Not on file  Social Needs  . Financial resource strain: Not on file  . Food insecurity    Worry: Not on file    Inability: Not on file  . Transportation needs    Medical: Not on file    Non-medical: Not on file  Tobacco Use  . Smoking status: Never Smoker  . Smokeless tobacco: Never Used  Substance and Sexual Activity  . Alcohol use: Never    Frequency: Never  . Drug use: Never  . Sexual activity: Not on file  Lifestyle  . Physical activity    Days per week: Not on file  Minutes per session: Not on file  . Stress: Not on file  Relationships  . Social Musicianconnections    Talks on phone: Not on file    Gets together: Not on file    Attends religious service: Not on file    Active member of club or organization: Not on file    Attends meetings of clubs or organizations: Not on file    Relationship status: Not on file  Other Topics Concern  . Not on file  Social History Narrative  . Not on file   Family History  Problem Relation Age of Onset  . Diabetes Mother   . Hypertension  Father    Past Surgical History:  Procedure Laterality Date  . ABDOMINAL SURGERY    . HERNIA REPAIR     r inguinal      Mardella LaymanHagler, Amir Fick, MD 12/14/18 [phone]

## 2018-12-13 NOTE — ED Triage Notes (Signed)
Patient presents to Urgent Care with complaints of right calf pain since feeling like he pulled it while on duty last night. Patient reports it felt like he pulled a muscle or tendon, states it still feels like he is having a charlie horse that will not go away.

## 2019-01-05 IMAGING — CT CT HEART SCORING
2 series · 16 of 20 positions shown, 18 images · non-contrast
Comparison: None.

EXAM:
OVER-READ INTERPRETATION  CT CHEST

The following report is an over-read performed by radiologist Dr.
does not include interpretation of cardiac or coronary anatomy or
pathology. The coronary calcium score interpretation by the
cardiologist is attached.
TECHNIQUE: The patient was scanned on a Siemens Force scanner. Axial
non-contrast 3 mm slices were carried out through the heart. The
data set was analyzed on a dedicated work station and scored using
the Agatson method.

[Series 2: casc 3.0 i36f 2 bestdiast 75 % · axial · 0.43mm/px · z∈[-235,-139]mm · 8 of 42 slices shown, 10 images]
[im 5/42  vessel]
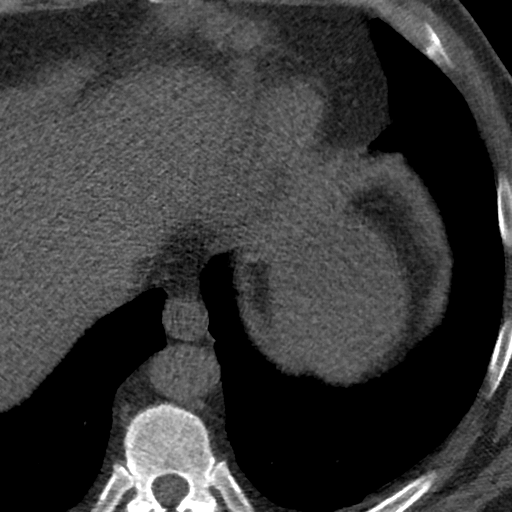
[im 5/42  lung]
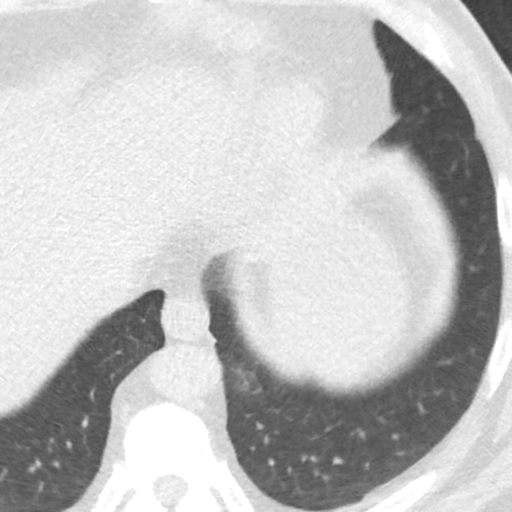
[im 10/42  vessel]
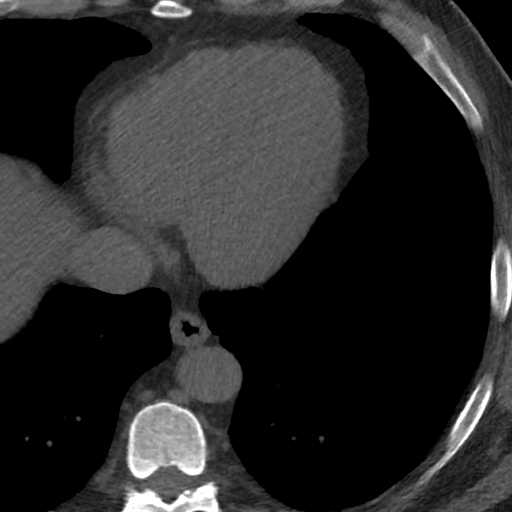
[im 14/42  vessel]
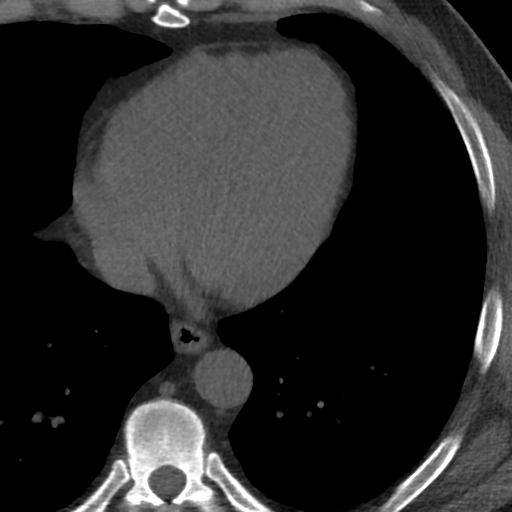
[im 19/42  vessel]
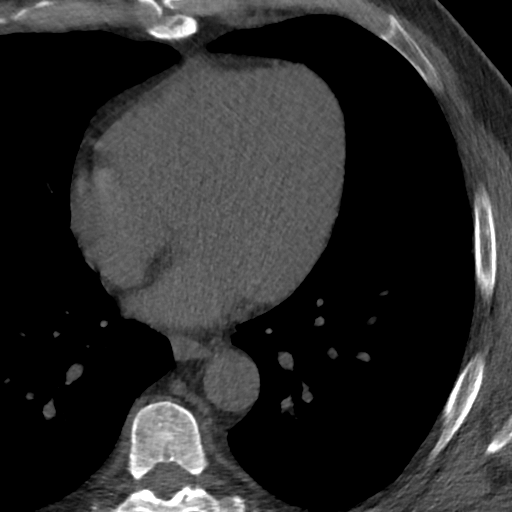
[im 23/42  vessel]
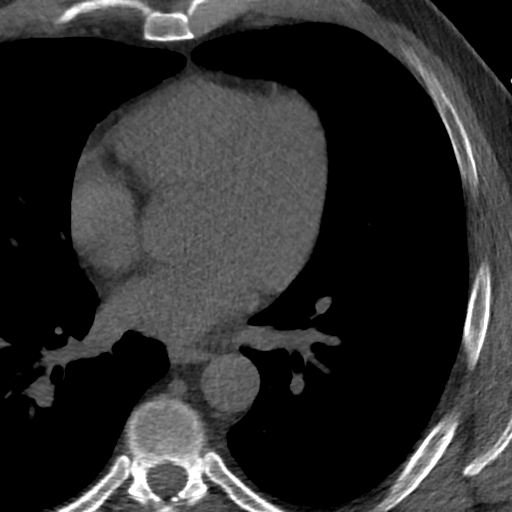
[im 23/42  lung]
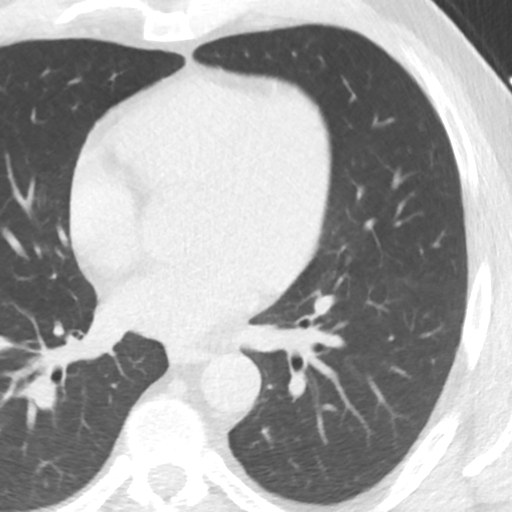
[im 28/42  vessel]
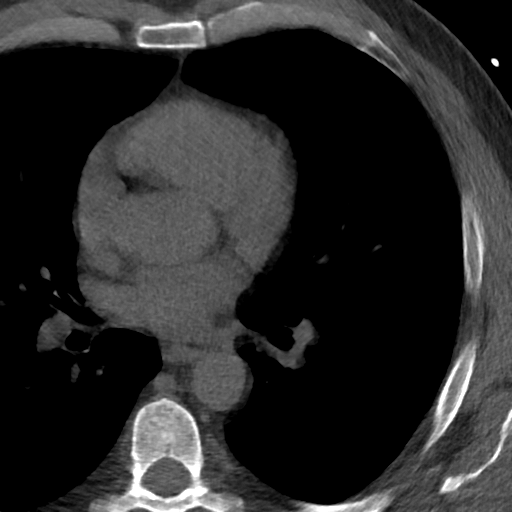
[im 32/42  vessel]
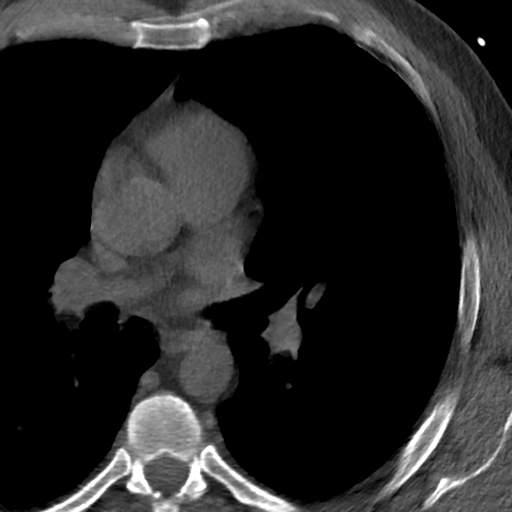
[im 37/42  vessel]
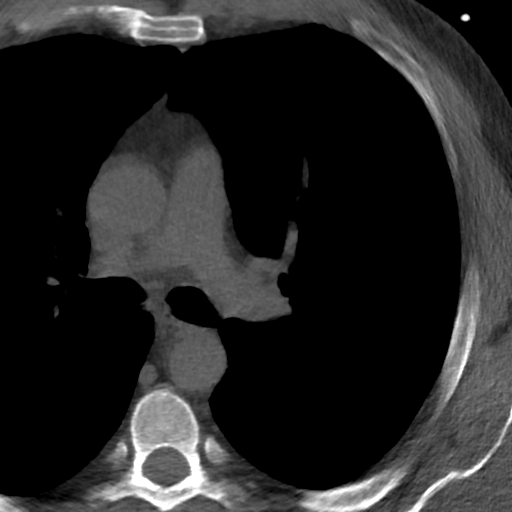

[Series 4: lung st 75 % · axial · 0.77mm/px · z∈[-234,-138]mm · 8 of 42 slices shown]
[im 5/42  lung]
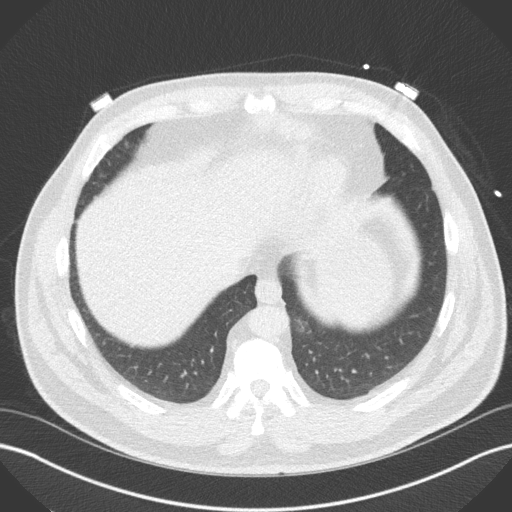
[im 10/42  lung]
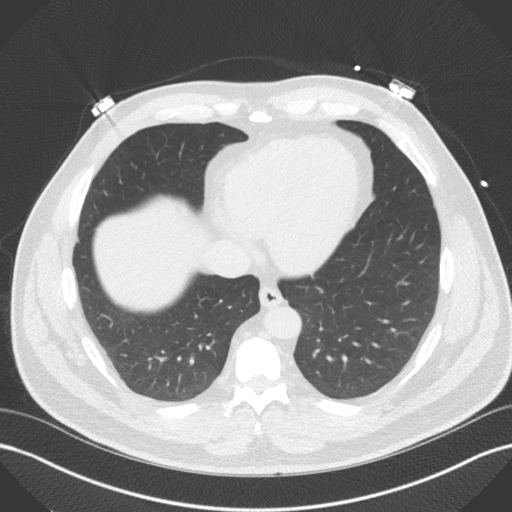
[im 14/42  lung]
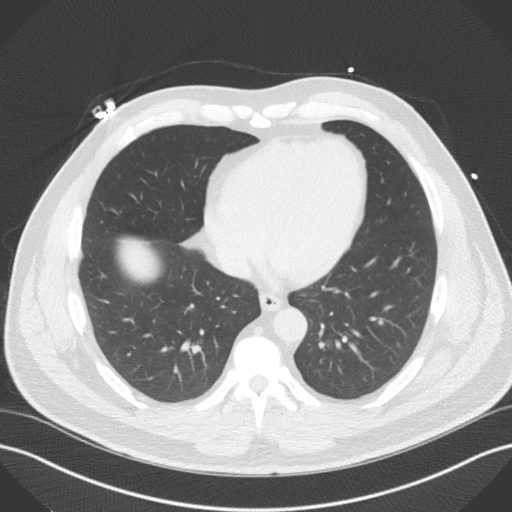
[im 19/42  lung]
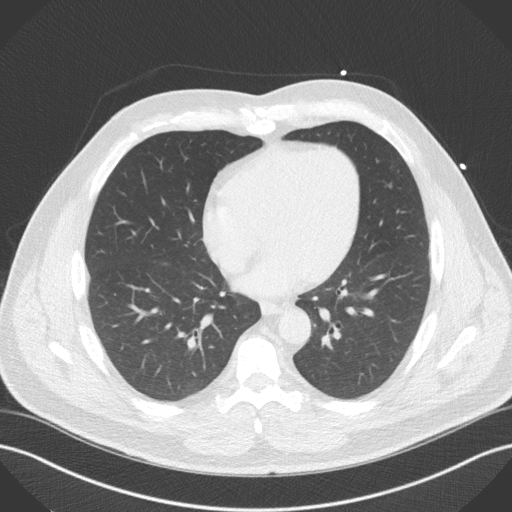
[im 23/42  lung]
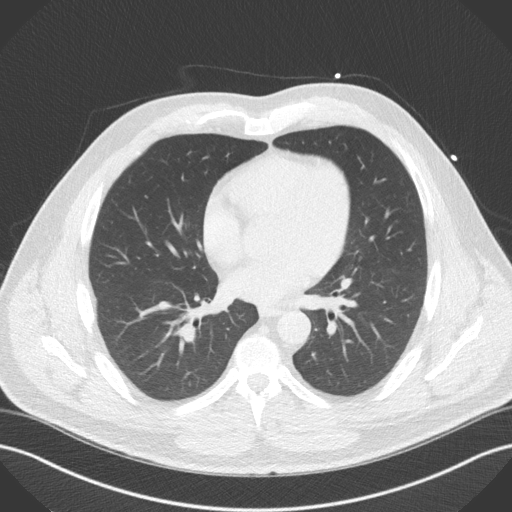
[im 28/42  lung]
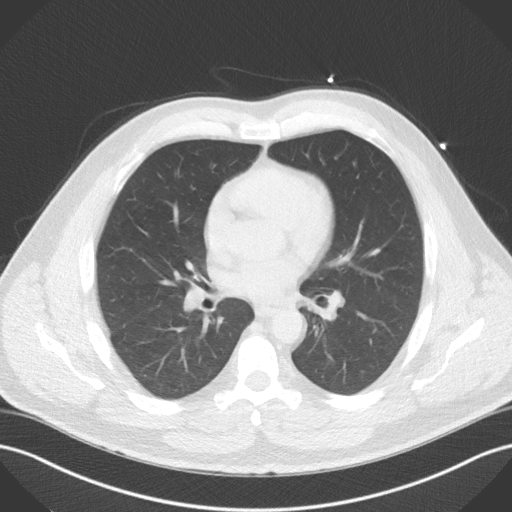
[im 32/42  lung]
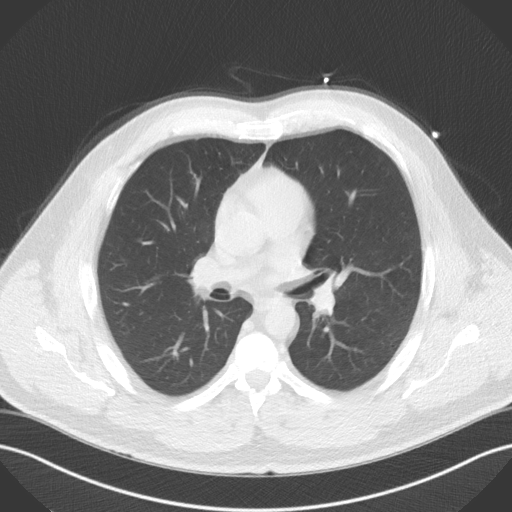
[im 37/42  lung]
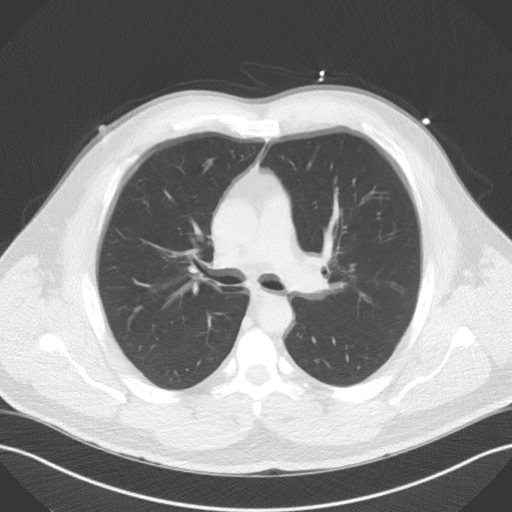

[16 of 20 positions shown; findings below may reference images not displayed]

FINDINGS: The noncardiac portions of the mediastinum appear unremarkable.

No pleural effusion identified. Tiny right middle lobe lung nodule
is identified measuring 3 mm, image [DATE]. Also in the right middle
lobe is a 4 mm lung nodule, image [DATE].

No acute abnormality identified.

No focal bone abnormalities.
IMPRESSION: 1. No acute noncardiac abnormalities identified.
2. Tiny right middle lobe lung nodules noted measuring up to 4 mm.
No follow-up needed if patient is low-risk (and has no known or
suspected primary neoplasm). Non-contrast chest CT can be considered
in 12 months if patient is high-risk. This recommendation follows
the consensus statement: Guidelines for Management of Incidental
Pulmonary Nodules Detected on CT Images: From the [REDACTED]AL DATA:  Risk stratification

EXAM:
Coronary Calcium Score

MEDICATIONS:
None
FINDINGS: Non-cardiac: See separate report from [REDACTED].

Ascending Aorta: Normal size, no calcifications.

Pericardium: Normal.

Coronary arteries: Normal origin.
IMPRESSION: Coronary calcium score of 0. This was 0 percentile for age and sex
matched control.

Sumeet Keegan

## 2019-06-08 ENCOUNTER — Encounter: Payer: Self-pay | Admitting: Cardiology

## 2019-06-08 ENCOUNTER — Ambulatory Visit (INDEPENDENT_AMBULATORY_CARE_PROVIDER_SITE_OTHER): Payer: 59 | Admitting: Cardiology

## 2019-06-08 ENCOUNTER — Other Ambulatory Visit: Payer: Self-pay

## 2019-06-08 VITALS — BP 136/94 | HR 86 | Ht 70.0 in | Wt 227.0 lb

## 2019-06-08 DIAGNOSIS — G4733 Obstructive sleep apnea (adult) (pediatric): Secondary | ICD-10-CM

## 2019-06-08 DIAGNOSIS — R002 Palpitations: Secondary | ICD-10-CM

## 2019-06-08 DIAGNOSIS — I10 Essential (primary) hypertension: Secondary | ICD-10-CM

## 2019-06-08 DIAGNOSIS — I80292 Phlebitis and thrombophlebitis of other deep vessels of left lower extremity: Secondary | ICD-10-CM | POA: Diagnosis not present

## 2019-06-08 MED ORDER — TELMISARTAN 40 MG PO TABS: 40.0000 mg | ORAL_TABLET | Freq: Every day | ORAL | 3 refills | Status: DC

## 2019-06-08 NOTE — Progress Notes (Signed)
[address] [place], [place]  [address] Phone: [phone] Fax:  [phone]  Date:  06/08/2019   ID:  Mathew Hernandez, DOB 13-Aug-1965, MRN [id number]  PCP:  Lonie Peak, PA-C   History of Present Illness: Mathew Hernandez is a 54 y.o. male for follow-up of PVCs palpitations..  I previously had skips from [facility]. [place]. PVC's. Metoprolol given. Police.  Feeling better.  He would like to have metoprolol.    He is taking testosterone supplementation. Palps have improved. No smoking.  Has obstructive sleep apnea, having some issues with his CPAP.  Mother died. BP 160/105 sometimes at home.   Testosterone therapy.  OSA -CPAP 5 hrs   Wt Readings from Last 3 Encounters:  06/08/19 227 lb (103 kg)  12/21/17 226 lb (102.5 kg)  11/01/17 200 lb (90.7 kg)     Past Medical History:  Diagnosis Date  . Heart palpitations     Past Surgical History:  Procedure Laterality Date  . ABDOMINAL SURGERY    . HERNIA REPAIR     r inguinal    Current Outpatient Medications  Medication Sig Dispense Refill  . metoprolol succinate (TOPROL-XL) 25 MG 24 hr tablet Take 1 tablet (25 mg total) by mouth daily. 90 tablet 3  . testosterone cypionate (DEPOTESTOSTERONE CYPIONATE) 200 MG/ML injection SMARTSIG:1.5 Milliliter(s) IM Every 2 Weeks    . telmisartan (MICARDIS) 40 MG tablet Take 1 tablet (40 mg total) by mouth daily. 90 tablet 3   No current facility-administered medications for this visit.    Allergies:   No Known Allergies  Social History:  The patient  reports that he has never smoked. He has never used smokeless tobacco. He reports that he does not drink alcohol or use drugs. 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   Family History  Problem Relation Age of Onset  . Diabetes Mother   . Hypertension Father   HTN in family. No early CAD, SCD  ROS:  Please see the history of present illness.  All other review of systems negative. PHYSICAL EXAM: VS:  BP (!)  136/94   Pulse 86   Ht 5\' 10"  (1.778 m)   Wt 227 lb (103 kg)   BMI 32.57 kg/m  GEN: Well nourished, well developed, in no acute distress  HEENT: normal  Neck: no JVD, carotid bruits, or masses Cardiac: RRR; no murmurs, rubs, or gallops,no edema  Respiratory:  clear to auscultation bilaterally, normal work of breathing GI: soft, nontender, nondistended, + BS MS: no deformity or atrophy  Skin: warm and dry, no rash Neuro:  Alert and Oriented x 3, Strength and sensation are intact Psych: euthymic mood, full affect    EKG: 06/07/2018 1-86 sinus rhythm nonspecific T wave flattening prior sinus rhythm with PVCs noted 11/01/2017-prior ectopic atrial rhythm, negative T waves in the flexion and lead 3. rhythm, 77, sinus arrhythmia, normal intervals. Labs: LDL 151 triglycerides 263 01/02/2018 5.6 hemoglobin 18.7 creatinine 1.2 potassium 4.4  ASSESSMENT AND PLAN:   Palpitations -I am okay with him continuing with his Toprol-XL 25 mg once a day.  PVCs noted on telemetry in emergency department.  Previously normal stress test and echocardiogram.  No changes made to medications.  No side effects.  Cardiac risk - coronary calcium score 0.   Continue with diet, exercise, vegetables.  I think it be a good idea to go ahead and work on his blood pressures.  Essential hypertension -I am  going to give him telmisartan 40 mg once a day.  Continue to check blood pressures.  Continue with Toprol.  In 1 month we will have him see virtual follow-up with APP.  Could always add HCTZ if necessary.  He is getting his blood work checked every 6 months by Cyndi Bender, PA.  Need to make sure that creatinine and potassium is reasonable with angiotensin receptor blocker.  OSA -CPAP-using   Signed, Candee Furbish, MD [facility]  06/08/2019 3:52 PM

## 2019-06-08 NOTE — Patient Instructions (Addendum)
Medication Instructions:  Please start Telmisartan 40 mg daily. Continue all other medications as listed.  *If you need a refill on your cardiac medications before your next appointment, please call your pharmacy*  Follow-Up: At [facility], you and your health needs are our priority.  As part of our continuing mission to provide you with exceptional heart care, we have created designated Provider Care Teams.  These Care Teams include your primary Cardiologist (physician) and Advanced Practice Providers (APPs -  Physician Assistants and Nurse Practitioners) who all work together to provide you with the care you need, when you need it.  Your next appointment:   4 week(s)  The format for your next appointment:   Virtual Visit   Provider:   Nada Boozer, NP  Thank you for choosing [facility]!!    YOUR CARDIOLOGY TEAM HAS ARRANGED FOR AN E-VISIT FOR YOUR APPOINTMENT - PLEASE REVIEW IMPORTANT INFORMATION BELOW SEVERAL DAYS PRIOR TO YOUR APPOINTMENT  Due to the recent COVID-19 pandemic, we are transitioning in-person office visits to tele-medicine visits in an effort to decrease unnecessary exposure to our patients, their families, and staff. These visits are billed to your insurance just like a normal visit is. We also encourage you to sign up for MyChart if you have not already done so. You will need a smartphone if possible. For patients that do not have this, we can still complete the visit using a regular telephone but do prefer a smartphone to enable video when possible. You may have a family member that lives with you that can help. If possible, we also ask that you have a blood pressure cuff and scale at home to measure your blood pressure, heart rate and weight prior to your scheduled appointment. Patients with clinical needs that need an in-person evaluation and testing will still be able to come to the office if absolutely necessary. If you have any questions, feel free to  call our office.  YOUR PROVIDER WILL BE USING THE FOLLOWING PLATFORM TO COMPLETE YOUR VISIT: [email or web address] THE DAY OF YOUR APPOINTMENT  Approximately 15 minutes prior to your scheduled appointment, you will receive a telephone call from one of [facility] team - your caller ID may say "Unknown caller."  Our staff will confirm medications, vital signs for the day and any symptoms you may be experiencing. Please have this information available prior to the time of visit start. It may also be helpful for you to have a pad of paper and pen handy for any instructions given during your visit. They will also walk you through joining the smartphone meeting if this is a video visit.  CONSENT FOR TELE-HEALTH VISIT - PLEASE REVIEW  I hereby voluntarily request, consent and authorize [facility] and its employed or contracted physicians, physician assistants, nurse practitioners or other licensed health care professionals (the Practitioner), to provide me with telemedicine health care services (the "Services") as deemed necessary by the treating Practitioner. I acknowledge and consent to receive the Services by the Practitioner via telemedicine. I understand that the telemedicine visit will involve communicating with the Practitioner through live audiovisual communication technology and the disclosure of certain medical information by electronic transmission. I acknowledge that I have been given the opportunity to request an in-person assessment or other available alternative prior to the telemedicine visit and am voluntarily participating in the telemedicine visit.  I understand that I have the right to withhold or withdraw my consent to the use of telemedicine in the course of my  care at any time, without affecting my right to future care or treatment, and that the Practitioner or I may terminate the telemedicine visit at any time. I understand that I have the right to inspect all information obtained and/or recorded in  the course of the telemedicine visit and may receive copies of available information for a reasonable fee.  I understand that some of the potential risks of receiving the Services via telemedicine include:  Marland Kitchen Delay or interruption in medical evaluation due to technological equipment failure or disruption; . Information transmitted may not be sufficient (e.g. poor resolution of images) to allow for appropriate medical decision making by the Practitioner; and/or  . In rare instances, security protocols could fail, causing a breach of personal health information.  Furthermore, I acknowledge that it is my responsibility to provide information about my medical history, conditions and care that is complete and accurate to the best of my ability. I acknowledge that Practitioner's advice, recommendations, and/or decision may be based on factors not within their control, such as incomplete or inaccurate data provided by me or distortions of diagnostic images or specimens that may result from electronic transmissions. I understand that the practice of medicine is not an exact science and that Practitioner makes no warranties or guarantees regarding treatment outcomes. I acknowledge that I will receive a copy of this consent concurrently upon execution via email to the email address I last provided but may also request a printed copy by calling the office of [place].    I understand that my insurance will be billed for this visit.   I have read or had this consent read to me. . I understand the contents of this consent, which adequately explains the benefits and risks of the Services being provided via telemedicine.  . I have been provided ample opportunity to ask questions regarding this consent and the Services and have had my questions answered to my satisfaction. . I give my informed consent for the services to be provided through the use of telemedicine in my medical care  By participating in this  telemedicine visit I agree to the above.

## 2019-07-11 ENCOUNTER — Telehealth: Payer: 59 | Admitting: Cardiology

## 2020-02-02 ENCOUNTER — Encounter: Payer: Self-pay | Admitting: Cardiology

## 2020-02-02 ENCOUNTER — Other Ambulatory Visit: Payer: Self-pay

## 2020-02-02 ENCOUNTER — Ambulatory Visit (INDEPENDENT_AMBULATORY_CARE_PROVIDER_SITE_OTHER): Payer: 59 | Admitting: Cardiology

## 2020-02-02 VITALS — BP 110/80 | HR 93 | Ht 70.0 in | Wt 231.0 lb

## 2020-02-02 DIAGNOSIS — R002 Palpitations: Secondary | ICD-10-CM | POA: Diagnosis not present

## 2020-02-02 DIAGNOSIS — I10 Essential (primary) hypertension: Secondary | ICD-10-CM

## 2020-02-02 DIAGNOSIS — G4733 Obstructive sleep apnea (adult) (pediatric): Secondary | ICD-10-CM

## 2020-02-02 NOTE — Progress Notes (Signed)
Cardiology Office Note:    Date:  02/02/2020   ID:  Mathew Hernandez, DOB 1966-03-17, MRN [id number]  PCP:  Lonie Peak, PA-C  [facility] Cardiologist:  Donato Schultz, MD  [facility] Electrophysiologist:  None   Referring MD: Lonie Peak, PA-C    History of Present Illness:    Mathew Hernandez is a 54 y.o. male here for follow-up of palpitations.  He has been feeling more frequent skips.  This worries him sometimes.  Denies any chest pain.  No fevers chills nausea vomiting.  Stressful situations with [employer].  Past Medical History:  Diagnosis Date  . Heart palpitations     Past Surgical History:  Procedure Laterality Date  . ABDOMINAL SURGERY    . HERNIA REPAIR     r inguinal    Current Medications: Current Meds  Medication Sig  . atorvastatin (LIPITOR) 20 MG tablet Take 20 mg by mouth daily.  . metoprolol succinate (TOPROL-XL) 50 MG 24 hr tablet Take 50 mg by mouth daily. Take with or immediately following a meal.  . telmisartan (MICARDIS) 40 MG tablet Take 1 tablet (40 mg total) by mouth daily.  Marland Kitchen testosterone cypionate (DEPOTESTOSTERONE CYPIONATE) 200 MG/ML injection SMARTSIG:1.5 Milliliter(s) IM Every 2 Weeks  . [DISCONTINUED] metoprolol tartrate (LOPRESSOR) 50 MG tablet Take 50 mg by mouth daily.     Allergies:   Patient has no known allergies.   Social History   Socioeconomic History  . Marital status: Single    Spouse name: Not on file  . Number of children: Not on file  . Years of education: Not on file  . Highest education level: Not on file  Occupational History  . Not on file  Tobacco Use  . Smoking status: Never Smoker  . Smokeless tobacco: Never Used  Vaping Use  . Vaping Use: Never used  Substance and Sexual Activity  . Alcohol use: Never  . Drug use: Never  . Sexual activity: Not on file  Other Topics Concern  . Not on file  Social History Narrative  . Not on file   Social Determinants of Health    Financial Resource Strain:   . Difficulty of Paying Living Expenses: Not on file  Food Insecurity:   . Worried About Programme researcher, broadcasting/film/video in the Last Year: Not on file  . Ran Out of Food in the Last Year: Not on file  Transportation Needs:   . Lack of Transportation (Medical): Not on file  . Lack of Transportation (Non-Medical): Not on file  Physical Activity:   . Days of Exercise per Week: Not on file  . Minutes of Exercise per Session: Not on file  Stress:   . Feeling of Stress : Not on file  Social Connections:   . Frequency of Communication with Friends and Family: Not on file  . Frequency of Social Gatherings with Friends and Family: Not on file  . Attends Religious Services: Not on file  . Active Member of Clubs or Organizations: Not on file  . Attends Banker Meetings: Not on file  . Marital Status: Not on file     Family History: The patient's family history includes Diabetes in his mother; Hypertension in his father.  ROS:   Please see the history of present illness.     All other systems reviewed and are negative.  EKGs/Labs/Other Studies Reviewed:     EKG:  EKG is  ordered today.  The ekg ordered today demonstrates  sinus rhythm PVCs 93  Recent Labs: No results found for requested labs within last 8760 hours.  Recent Lipid Panel No results found for: CHOL, TRIG, HDL, CHOLHDL, VLDL, LDLCALC, LDLDIRECT  Physical Exam:    VS:  BP 110/80   Pulse 93   Ht 5\' 10"  (1.778 m)   Wt 231 lb (104.8 kg)   BMI 33.15 kg/m     Wt Readings from Last 3 Encounters:  02/02/20 231 lb (104.8 kg)  06/08/19 227 lb (103 kg)  12/21/17 226 lb (102.5 kg)     GEN:  Well nourished, well developed in no acute distress HEENT: Normal NECK: No JVD; No carotid bruits LYMPHATICS: No lymphadenopathy CARDIAC: RRR, no murmurs, rubs, gallops, ectopy RESPIRATORY:  Clear to auscultation without rales, wheezing or rhonchi  ABDOMEN: Soft, non-tender,  non-distended MUSCULOSKELETAL:  No edema; No deformity  SKIN: Warm and dry NEUROLOGIC:  Alert and oriented x 3 PSYCHIATRIC:  Normal affect   ASSESSMENT:    1. Palpitations   2. Essential hypertension   3. OSA (obstructive sleep apnea)    PLAN:    In order of problems listed above:  PVCs -Intermittent burden.  Offered flecainide 50 twice a day.  Would like to try without this medication at this time.  Continue with Toprol-XL 50 mg once a day.  If he must, can try an additional 25 or 50 mg to see if this helps.  Essential hypertension -Much better control currently.  Continue with current prescriptions.  OSA -Having trouble sometimes with his CPAP machine.    Medication Adjustments/Labs and Tests Ordered: Current medicines are reviewed at length with the patient today.  Concerns regarding medicines are outlined above.  Orders Placed This Encounter  Procedures  . EKG 12-Lead   No orders of the defined types were placed in this encounter.   Patient Instructions  Medication Instructions:  The current medical regimen is effective;  continue present plan and medications.  *If you need a refill on your cardiac medications before your next appointment, please call your pharmacy*  Follow-Up: At [facility], you and your health needs are our priority.  As part of our continuing mission to provide you with exceptional heart care, we have created designated Provider Care Teams.  These Care Teams include your primary Cardiologist (physician) and Advanced Practice Providers (APPs -  Physician Assistants and Nurse Practitioners) who all work together to provide you with the care you need, when you need it.  We recommend signing up for the patient portal called "MyChart".  Sign up information is provided on this After Visit Summary.  MyChart is used to connect with patients for Virtual Visits (Telemedicine).  Patients are able to view lab/test results, encounter notes, upcoming  appointments, etc.  Non-urgent messages can be sent to your provider as well.   To learn more about what you can do with MyChart, go to [facility].    Your next appointment:   3 month(s)  The format for your next appointment:   In Person  Provider:   [email or web address], MD   Thank you for choosing [facility]!!        Signed, [facility], MD  02/02/2020 5:10 PM    [facility]

## 2020-02-02 NOTE — Patient Instructions (Signed)
Medication Instructions:  °The current medical regimen is effective;  continue present plan and medications. ° °*If you need a refill on your cardiac medications before your next appointment, please call your pharmacy* ° °Follow-Up: °At [facility], you and your health needs are our priority.  As part of our continuing mission to provide you with exceptional heart care, we have created designated Provider Care Teams.  These Care Teams include your primary Cardiologist (physician) and Advanced Practice Providers (APPs -  Physician Assistants and Nurse Practitioners) who all work together to provide you with the care you need, when you need it. ° °We recommend signing up for the patient portal called "MyChart".  Sign up information is provided on this After Visit Summary.  MyChart is used to connect with patients for Virtual Visits (Telemedicine).  Patients are able to view lab/test results, encounter notes, upcoming appointments, etc.  Non-urgent messages can be sent to your provider as well.   °To learn more about what you can do with MyChart, go to [email or web address].   ° °Your next appointment:   °3 month(s) ° °The format for your next appointment:   °In Person ° °Provider:   °Mark Skains, MD ° ° °Thank you for choosing [facility]!! ° ° ° ° °

## 2020-05-10 ENCOUNTER — Other Ambulatory Visit: Payer: Self-pay

## 2020-05-10 ENCOUNTER — Encounter: Payer: Self-pay | Admitting: Cardiology

## 2020-05-10 ENCOUNTER — Ambulatory Visit: Payer: 59 | Admitting: Cardiology

## 2020-05-10 VITALS — BP 130/92 | HR 85 | Ht 70.0 in | Wt 244.2 lb

## 2020-05-10 DIAGNOSIS — R002 Palpitations: Secondary | ICD-10-CM | POA: Diagnosis not present

## 2020-05-10 DIAGNOSIS — I10 Essential (primary) hypertension: Secondary | ICD-10-CM

## 2020-05-10 DIAGNOSIS — I493 Ventricular premature depolarization: Secondary | ICD-10-CM

## 2020-05-10 DIAGNOSIS — G4733 Obstructive sleep apnea (adult) (pediatric): Secondary | ICD-10-CM

## 2020-05-10 NOTE — Progress Notes (Signed)
Cardiology Office Note:    Date:  05/10/2020   ID:  JAHLIL ZILLER, DOB Jan 04, 1966, MRN [id number]  PCP:  Lonie Peak, PA-C  [facility] Cardiologist:  Donato Schultz, MD  [facility] Electrophysiologist:  None   Referring MD: Lonie Peak, PA-C     History of Present Illness:    Mathew Hernandez is a 55 y.o. male here for the follow-up of palpitations.  Works at the [employer].  PVCs were noted.  Previously offered him flecainide 50 mg but he wanted to try without medication.  We are continuing the Toprol 50 mg a day.  Overall has been feeling quite well.  No recent bursts of PVCs.  Explained how these can be cyclical.  [place]  His father in his early 32s will be residing there.  No chest pain no fevers no chills.  Past Medical History:  Diagnosis Date  . Heart palpitations     Past Surgical History:  Procedure Laterality Date  . ABDOMINAL SURGERY    . HERNIA REPAIR     r inguinal    Current Medications: Current Meds  Medication Sig  . atorvastatin (LIPITOR) 20 MG tablet Take 20 mg by mouth daily.  . metoprolol succinate (TOPROL-XL) 50 MG 24 hr tablet Take 50 mg by mouth daily. Take with or immediately following a meal.  . telmisartan (MICARDIS) 40 MG tablet Take 1 tablet (40 mg total) by mouth daily.  Marland Kitchen testosterone cypionate (DEPOTESTOSTERONE CYPIONATE) 200 MG/ML injection SMARTSIG:1.5 Milliliter(s) IM Every 2 Weeks     Allergies:   Patient has no known allergies.   Social History   Socioeconomic History  . Marital status: Single    Spouse name: Not on file  . Number of children: Not on file  . Years of education: Not on file  . Highest education level: Not on file  Occupational History  . Not on file  Tobacco Use  . Smoking status: Never Smoker  . Smokeless tobacco: Never Used  Vaping Use  . Vaping Use: Never used  Substance and Sexual Activity  . Alcohol use: Never  .  Drug use: Never  . Sexual activity: Not on file  Other Topics Concern  . Not on file  Social History Narrative  . Not on file   Social Determinants of Health   Financial Resource Strain: Not on file  Food Insecurity: Not on file  Transportation Needs: Not on file  Physical Activity: Not on file  Stress: Not on file  Social Connections: Not on file     Family History: The patient's family history includes Diabetes in his mother; Hypertension in his father.  ROS:   Please see the history of present illness.     All other systems reviewed and are negative.  EKGs/Labs/Other Studies Reviewed:    The following studies were reviewed today:  Echo 2015:  - Left ventricle: The cavity size was normal. Systolic function was  normal. The estimated ejection fraction was in the range of 60%  to 65%. Wall motion was normal; there were no regional wall  motion abnormalities.    EKG: 02/02/2020-EKG showed sinus rhythm with PVCs.  Recent Labs: No results found for requested labs within last 8760 hours.  Recent Lipid Panel No results found for: CHOL, TRIG, HDL, CHOLHDL, VLDL, LDLCALC, LDLDIRECT   Risk Assessment/Calculations:       Physical Exam:    VS:  BP (!) 130/92 (BP  Location: Left Arm, Patient Position: Sitting, Cuff Size: Large)   Pulse 85   Ht 5\' 10"  (1.778 m)   Wt 244 lb 3.2 oz (110.8 kg)   SpO2 95% Comment: at rest  BMI 35.04 kg/m     Wt Readings from Last 3 Encounters:  05/10/20 244 lb 3.2 oz (110.8 kg)  02/02/20 231 lb (104.8 kg)  06/08/19 227 lb (103 kg)     GEN:  Well nourished, well developed in no acute distress HEENT: Normal NECK: No JVD; No carotid bruits LYMPHATICS: No lymphadenopathy CARDIAC: RRR, no murmurs, rubs, gallops RESPIRATORY:  Clear to auscultation without rales, wheezing or rhonchi  ABDOMEN: Soft, non-tender, non-distended MUSCULOSKELETAL:  No edema; No deformity  SKIN: Warm and dry NEUROLOGIC:  Alert and oriented x  3 PSYCHIATRIC:  Normal affect   ASSESSMENT:    1. Palpitations   2. Essential hypertension   3. OSA (obstructive sleep apnea)   4. PVC (premature ventricular contraction)    PLAN:    In order of problems listed above:  PVCs/palpitations - Overall been doing quite well recently. - He has been taking his Toprol 50 mg once a day. - If he begins to have more episodes, he can always take an additional Toprol.  He will let 08/06/19 now if this becomes a running trend and we can increase the dose or give him more as needed metoprolol. -Also working on conservative measures such as decreasing caffeine.  Obstructive sleep apnea - Continue with CPAP.  Uses nasal spray.  Warned him against using Afrin repeatedly.  Essential hypertension - Minimally elevated today.  Usually under good control.  Continue with telmisartan.        Medication Adjustments/Labs and Tests Ordered: Current medicines are reviewed at length with the patient today.  Concerns regarding medicines are outlined above.  No orders of the defined types were placed in this encounter.  No orders of the defined types were placed in this encounter.   Patient Instructions  Medication Instructions:  The current medical regimen is effective;  continue present plan and medications.  *If you need a refill on your cardiac medications before your next appointment, please call your pharmacy*  Follow-Up: At [facility], you and your health needs are our priority.  As part of our continuing mission to provide you with exceptional heart care, we have created designated Provider Care Teams.  These Care Teams include your primary Cardiologist (physician) and Advanced Practice Providers (APPs -  Physician Assistants and Nurse Practitioners) who all work together to provide you with the care you need, when you need it.  We recommend signing up for the patient portal called "MyChart".  Sign up information is provided on this After Visit  Summary.  MyChart is used to connect with patients for Virtual Visits (Telemedicine).  Patients are able to view lab/test results, encounter notes, upcoming appointments, etc.  Non-urgent messages can be sent to your provider as well.   To learn more about what you can do with MyChart, go to [facility].    Your next appointment:   12 month(s)  The format for your next appointment:   In Person  Provider:   [email or web address], MD   Thank you for choosing [facility]!!        Signed, [facility], MD  05/10/2020 10:09 AM    [facility]

## 2020-05-10 NOTE — Patient Instructions (Signed)
Medication Instructions:  The current medical regimen is effective;  continue present plan and medications.  *If you need a refill on your cardiac medications before your next appointment, please call your pharmacy*  Follow-Up: At [facility], you and your health needs are our priority.  As part of our continuing mission to provide you with exceptional heart care, we have created designated Provider Care Teams.  These Care Teams include your primary Cardiologist (physician) and Advanced Practice Providers (APPs -  Physician Assistants and Nurse Practitioners) who all work together to provide you with the care you need, when you need it.  We recommend signing up for the patient portal called "MyChart".  Sign up information is provided on this After Visit Summary.  MyChart is used to connect with patients for Virtual Visits (Telemedicine).  Patients are able to view lab/test results, encounter notes, upcoming appointments, etc.  Non-urgent messages can be sent to your provider as well.   To learn more about what you can do with MyChart, go to [email or web address].    Your next appointment:   12 month(s)  The format for your next appointment:   In Person  Provider:   Mark Skains, MD   Thank you for choosing [facility]!!      

## 2020-07-02 ENCOUNTER — Other Ambulatory Visit: Payer: Self-pay | Admitting: Cardiology

## 2021-06-12 ENCOUNTER — Other Ambulatory Visit: Payer: Self-pay

## 2021-06-12 ENCOUNTER — Ambulatory Visit
Admission: EM | Admit: 2021-06-12 | Discharge: 2021-06-12 | Disposition: A | Discharge status: Home / Self Care | Payer: 59 | Attending: Emergency Medicine | Admitting: Emergency Medicine

## 2021-06-12 DIAGNOSIS — R059 Cough, unspecified: Secondary | ICD-10-CM | POA: Diagnosis not present

## 2021-06-12 DIAGNOSIS — J189 Pneumonia, unspecified organism: Secondary | ICD-10-CM

## 2021-06-12 DIAGNOSIS — J069 Acute upper respiratory infection, unspecified: Secondary | ICD-10-CM

## 2021-06-12 MED ORDER — PROMETHAZINE-DM 6.25-15 MG/5ML PO SYRP: 5.0000 mL | ORAL_SOLUTION | Freq: Four times a day (QID) | ORAL | 0 refills | Status: AC | PRN

## 2021-06-12 MED ORDER — AMOXICILLIN-POT CLAVULANATE 875-125 MG PO TABS: 1.0000 | ORAL_TABLET | Freq: Two times a day (BID) | ORAL | 0 refills | Status: AC

## 2021-06-12 NOTE — ED Provider Notes (Signed)
[facility]    CSN: [id number] Arrival date & time: 06/12/21  1548    HISTORY   Chief Complaint  Patient presents with   Cough   Generalized Body Aches   HPI Mathew Hernandez is a 56 y.o. male. Patient reports onset of cough and a rattling sensation in his lungs for the past 4 days.  Patient endorses a history of allergies, denies a history of asthma.  Patient states he has been trying some over-the-counter medications with little relief.  Patient states he felt warm yesterday, denies fever, aches, chills, nausea, vomiting, diarrhea.  The history is provided by the patient.  Past Medical History:  Diagnosis Date   Heart palpitations    Patient Active Problem List   Diagnosis Date Noted   Thrombophlebitis of left leg (HCC) 06/08/2019   Heart palpitations    Past Surgical History:  Procedure Laterality Date   ABDOMINAL SURGERY     HERNIA REPAIR     r inguinal    Home Medications    Prior to Admission medications   Medication Sig Start Date End Date Taking? Authorizing Provider  atorvastatin (LIPITOR) 20 MG tablet Take 20 mg by mouth daily. 01/04/20   [provider]  metoprolol succinate (TOPROL-XL) 50 MG 24 hr tablet Take 50 mg by mouth daily. Take with or immediately following a meal.    [provider]  telmisartan (MICARDIS) 40 MG tablet Take 1 tablet by mouth once daily 07/02/20   Jake Bathe, MD  testosterone cypionate (DEPOTESTOSTERONE CYPIONATE) 200 MG/ML injection SMARTSIG:1.5 Milliliter(s) IM Every 2 Weeks 05/15/19   [provider]   Family History Family History  Problem Relation Age of Onset   Diabetes Mother    Hypertension Father    Social History Social History   Tobacco Use   Smoking status: Never   Smokeless tobacco: Never  Vaping Use   Vaping Use: Never used  Substance Use Topics   Alcohol use: Never   Drug use: Never   Allergies   Patient has no known allergies.  Review of Systems Review of  Systems Pertinent findings noted in history of present illness.   Physical Exam Triage Vital Signs ED Triage Vitals  Enc Vitals Group     BP 02/28/21 0827 (!) 147/82     Pulse Rate 02/28/21 0827 72     Resp 02/28/21 0827 18     Temp 02/28/21 0827 98.3 F (36.8 C)     Temp Source 02/28/21 0827 Oral     SpO2 02/28/21 0827 98 %     Weight --      Height --      Head Circumference --      Peak Flow --      Pain Score 02/28/21 0826 5     Pain Loc --      Pain Edu? --      Excl. in GC? --   No data found.  Updated Vital Signs BP (!) 154/105 (BP Location: Right Arm)    Pulse 96    Temp 99.8 F (37.7 C) (Oral)    Resp 20    SpO2 96%   Physical Exam Vitals and nursing note reviewed.  Constitutional:      General: He is not in acute distress.    Appearance: Normal appearance. He is not ill-appearing.  HENT:     Head: Normocephalic and atraumatic.     Salivary Glands: Right salivary gland is not diffusely enlarged or  tender. Left salivary gland is not diffusely enlarged or tender.     Right Ear: Tympanic membrane, ear canal and external ear normal. No drainage. No middle ear effusion. There is no impacted cerumen. Tympanic membrane is not erythematous or bulging.     Left Ear: Tympanic membrane, ear canal and external ear normal. No drainage.  No middle ear effusion. There is no impacted cerumen. Tympanic membrane is not erythematous or bulging.     Nose: Nose normal. No nasal deformity, septal deviation, mucosal edema, congestion or rhinorrhea.     Right Turbinates: Not enlarged, swollen or pale.     Left Turbinates: Not enlarged, swollen or pale.     Right Sinus: No maxillary sinus tenderness or frontal sinus tenderness.     Left Sinus: No maxillary sinus tenderness or frontal sinus tenderness.     Mouth/Throat:     Lips: Pink. No lesions.     Mouth: Mucous membranes are moist. No oral lesions.     Pharynx: Oropharynx is clear. Uvula midline. No posterior oropharyngeal erythema  or uvula swelling.     Tonsils: No tonsillar exudate. 0 on the right. 0 on the left.  Eyes:     General: Lids are normal.        Right eye: No discharge.        Left eye: No discharge.     Extraocular Movements: Extraocular movements intact.     Conjunctiva/sclera: Conjunctivae normal.     Right eye: Right conjunctiva is not injected.     Left eye: Left conjunctiva is not injected.  Neck:     Trachea: Trachea and phonation normal.  Cardiovascular:     Rate and Rhythm: Normal rate and regular rhythm.     Pulses: Normal pulses.     Heart sounds: Normal heart sounds. No murmur heard.   No friction rub. No gallop.  Pulmonary:     Effort: Pulmonary effort is normal. No accessory muscle usage, prolonged expiration or respiratory distress.     Breath sounds: No stridor, decreased air movement or transmitted upper airway sounds. Examination of the left-middle field reveals rhonchi and rales. Examination of the left-lower field reveals rhonchi and rales. Rhonchi and rales present. No decreased breath sounds or wheezing.  Chest:     Chest wall: No tenderness.  Musculoskeletal:        General: Normal range of motion.     Cervical back: Normal range of motion and neck supple. Normal range of motion.  Lymphadenopathy:     Cervical: No cervical adenopathy.  Skin:    General: Skin is warm and dry.     Findings: No erythema or rash.  Neurological:     General: No focal deficit present.     Mental Status: He is alert and oriented to person, place, and time.  Psychiatric:        Mood and Affect: Mood normal.        Behavior: Behavior normal.    Visual Acuity Right Eye Distance:   Left Eye Distance:   Bilateral Distance:    Right Eye Near:   Left Eye Near:    Bilateral Near:     UC Couse / Diagnostics / Procedures:    EKG  Radiology No results found.  Procedures Procedures (including critical care time)  UC Diagnoses / Final Clinical Impressions(s)   I have reviewed the triage  vital signs and the nursing notes.  Pertinent labs & imaging results that were available during my care  of the patient were reviewed by me and considered in my medical decision making (see chart for details).   Final diagnoses:  Acute upper respiratory infection  Cough, unspecified type  Pneumonia of left lower lobe due to infectious organism   Community-acquired pneumonia.  Possibly viral but could also be bacterial, patient provided with prescription for Augmentin for 5 days.  COVID/flu testing performed.  We will treat with Paxlovid if COVID is positive.  Patient is outside window benefit for Tamiflu.  Return precautions advised.  ED Prescriptions     Medication Sig Dispense Auth. Provider   amoxicillin-clavulanate (AUGMENTIN) 875-125 MG tablet Take 1 tablet by mouth 2 (two) times daily for 7 days. 14 tablet Theadora Rama Scales, PA-C   promethazine-dextromethorphan (PROMETHAZINE-DM) 6.25-15 MG/5ML syrup Take 5 mLs by mouth 4 (four) times daily as needed for cough. 180 mL Theadora Rama Scales, PA-C      PDMP not reviewed this encounter.  Pending results:  Labs Reviewed  COVID-19, FLU A+B NAA    Medications Ordered in UC: Medications - No data to display  Disposition Upon Discharge:  Condition: stable for discharge home Home: take medications as prescribed; routine discharge instructions as discussed; follow up as advised.  Patient presented with an acute illness with associated systemic symptoms and significant discomfort requiring urgent management. In my opinion, this is a condition that a prudent lay person (someone who possesses an average knowledge of health and medicine) may potentially expect to result in complications if not addressed urgently such as respiratory distress, impairment of bodily function or dysfunction of bodily organs.   Routine symptom specific, illness specific and/or disease specific instructions were discussed with the patient and/or caregiver at  length.   As such, the patient has been evaluated and assessed, work-up was performed and treatment was provided in alignment with urgent care protocols and evidence based medicine.  Patient/parent/caregiver has been advised that the patient may require follow up for further testing and treatment if the symptoms continue in spite of treatment, as clinically indicated and appropriate.  If the patient was tested for COVID-19, Influenza and/or RSV, then the patient/parent/guardian was advised to isolate at home pending the results of his/her diagnostic coronavirus test and potentially longer if theyre positive. I have also advised pt that if his/her COVID-19 test returns positive, it's recommended to self-isolate for at least 10 days after symptoms first appeared AND until fever-free for 24 hours without fever reducer AND other symptoms have improved or resolved. Discussed self-isolation recommendations as well as instructions for household member/close contacts as per the [facility] and [place] [facility], and also gave patient the COVID packet with this information.  Patient/parent/caregiver has been advised to return to the [facility] or PCP in 3-5 days if no better; to PCP or the Emergency Department if new signs and symptoms develop, or if the current signs or symptoms continue to change or worsen for further workup, evaluation and treatment as clinically indicated and appropriate  The patient will follow up with their current PCP if and as advised. If the patient does not currently have a PCP we will assist them in obtaining one.   The patient may need specialty follow up if the symptoms continue, in spite of conservative treatment and management, for further workup, evaluation, consultation and treatment as clinically indicated and appropriate.  Patient/parent/caregiver verbalized understanding and agreement of plan as discussed.  All questions were addressed during visit.  Please see discharge instructions below for  further details of plan.  Discharge  Instructions:   Discharge Instructions      You were tested for both COVID and influenza today, the result of your viral testing will be posted to your MyChart once it is complete, this typically takes 24 to 48 hours.  If there is a positive result, you will be contacted by phone with further recommendations, if any.  Due to the duration of your symptoms you would no longer benefit from antiviral therapy for influenza.  If your COVID test is positive, we will provide you with a prescription for Paxlovid.  I believe that you are suffering from a left-sided pneumonia at this time.  Diagnosis is based on the symptoms you have described to me and my physical exam findings.  I recommend that you begin antibiotics now.    Please see the list below for recommended medications, dosages and frequencies to provide relief of your current symptoms:     Amoxicillin-clavulanate (Augmentin):  take 1 tablet twice daily for 7 days, you can take it with or without food.  This antibiotic can cause upset stomach, this will resolve once antibiotics are complete.  You are welcome to use a probiotic, eat yogurt, take Imodium while taking this medication.  Please avoid other systemic medications such as Maalox, Pepto-Bismol or milk of magnesia as they can interfere with your body's ability to absorb the antibiotics.   Ibuprofen  (Advil, Motrin): This is a good anti-inflammatory medication which addresses aches, pains and inflammation of the upper airways that causes sinus and nasal congestion as well as in the lower airways which makes your cough feel tight and sometimes burn.  I recommend that you take between 400 to 600 mg every 6-8 hours as needed.      Promethazine DM: Promethazine is both a nasal decongestant and an antinausea medication that makes most patients feel fairly sleepy.  The DM is dextromethorphan, a cough suppressant found in many over-the-counter cough medications.   Please take 5 mL before bedtime to minimize your cough which will help you sleep better.  I have provided you with a prescription for this medication.      Conservative care is recommended at this time.  This includes rest, pushing clear fluids and activity as tolerated.  Warm beverages such as teas and broths versus cold beverages/popsicles and frozen sherbet/sorbet are your choice, both warm and cold are beneficial.  You may also notice that your appetite is reduced; this is okay as long as you are drinking plenty of clear fluids.   Please follow-up within the next 3 to 5 days either with your primary care provider if your symptoms do not resolve.       This office note has been dictated using Teaching laboratory technician.  Unfortunately, and despite my best efforts, this method of dictation can sometimes lead to occasional typographical or grammatical errors.  I apologize in advance if this occurs.     Theadora Rama Scales, PA-C 06/12/21 [phone]

## 2021-06-12 NOTE — ED Triage Notes (Signed)
Pt c/o body aches, cough and hearing "raddling" in his lungs.

## 2021-06-12 NOTE — Discharge Instructions (Addendum)
You were tested for both COVID and influenza today, the result of your viral testing will be posted to your MyChart once it is complete, this typically takes 24 to 48 hours.  If there is a positive result, you will be contacted by phone with further recommendations, if any.  Due to the duration of your symptoms you would no longer benefit from antiviral therapy for influenza.  If your COVID test is positive, we will provide you with a prescription for Paxlovid.  I believe that you are suffering from a left-sided pneumonia at this time.  Diagnosis is based on the symptoms you have described to me and my physical exam findings.  I recommend that you begin antibiotics now.    Please see the list below for recommended medications, dosages and frequencies to provide relief of your current symptoms:     Amoxicillin-clavulanate (Augmentin):  take 1 tablet twice daily for 7 days, you can take it with or without food.  This antibiotic can cause upset stomach, this will resolve once antibiotics are complete.  You are welcome to use a probiotic, eat yogurt, take Imodium while taking this medication.  Please avoid other systemic medications such as Maalox, Pepto-Bismol or milk of magnesia as they can interfere with your body's ability to absorb the antibiotics.   Ibuprofen  (Advil, Motrin): This is a good anti-inflammatory medication which addresses aches, pains and inflammation of the upper airways that causes sinus and nasal congestion as well as in the lower airways which makes your cough feel tight and sometimes burn.  I recommend that you take between 400 to 600 mg every 6-8 hours as needed.      Promethazine DM: Promethazine is both a nasal decongestant and an antinausea medication that makes most patients feel fairly sleepy.  The DM is dextromethorphan, a cough suppressant found in many over-the-counter cough medications.  Please take 5 mL before bedtime to minimize your cough which will help you sleep better.   I have provided you with a prescription for this medication.      Conservative care is recommended at this time.  This includes rest, pushing clear fluids and activity as tolerated.  Warm beverages such as teas and broths versus cold beverages/popsicles and frozen sherbet/sorbet are your choice, both warm and cold are beneficial.  You may also notice that your appetite is reduced; this is okay as long as you are drinking plenty of clear fluids.   Please follow-up within the next 3 to 5 days either with your primary care provider if your symptoms do not resolve.

## 2021-06-15 LAB — COVID-19, FLU A+B NAA
Influenza A, NAA: NOT DETECTED
Influenza B, NAA: NOT DETECTED
SARS-CoV-2, NAA: NOT DETECTED

## 2021-08-09 ENCOUNTER — Other Ambulatory Visit: Payer: Self-pay | Admitting: Cardiology

## 2021-10-22 ENCOUNTER — Other Ambulatory Visit: Payer: Self-pay | Admitting: Cardiology

## 2022-05-18 ENCOUNTER — Ambulatory Visit: Payer: 59 | Admitting: Cardiology
